# Patient Record
Sex: Female | Born: 1960 | Race: White | Hispanic: No | Marital: Married | Smoking: Never smoker
Health system: Southern US, Community
[De-identification: ages and names within clinical notes are randomized; demographics above are authoritative.]

## PROBLEM LIST (undated history)

## (undated) DIAGNOSIS — T7840XA Allergy, unspecified, initial encounter: Secondary | ICD-10-CM

## (undated) DIAGNOSIS — G2581 Restless legs syndrome: Secondary | ICD-10-CM

## (undated) HISTORY — DX: Allergy, unspecified, initial encounter: T78.40XA

## (undated) HISTORY — PX: TUBAL LIGATION: SHX77

## (undated) HISTORY — PX: EYE SURGERY: SHX253

## (undated) HISTORY — PX: NASAL SEPTUM SURGERY: SHX37

## (undated) HISTORY — PX: TMJ ARTHROPLASTY: SHX1066

---

## 2003-04-18 ENCOUNTER — Encounter (INDEPENDENT_AMBULATORY_CARE_PROVIDER_SITE_OTHER): Payer: Self-pay | Admitting: *Deleted

## 2003-04-19 ENCOUNTER — Ambulatory Visit (HOSPITAL_COMMUNITY): Admission: RE | Admit: 2003-04-19 | Discharge: 2003-04-19 | Payer: Self-pay | Admitting: Obstetrics and Gynecology

## 2004-07-09 ENCOUNTER — Other Ambulatory Visit: Admission: RE | Admit: 2004-07-09 | Discharge: 2004-07-09 | Payer: Self-pay | Admitting: Family Medicine

## 2005-07-12 ENCOUNTER — Other Ambulatory Visit: Admission: RE | Admit: 2005-07-12 | Discharge: 2005-07-12 | Payer: Self-pay | Admitting: Family Medicine

## 2006-02-08 ENCOUNTER — Emergency Department (HOSPITAL_COMMUNITY): Admission: EM | Admit: 2006-02-08 | Discharge: 2006-02-08 | Payer: Self-pay | Admitting: Family Medicine

## 2006-05-14 ENCOUNTER — Emergency Department (HOSPITAL_COMMUNITY): Admission: EM | Admit: 2006-05-14 | Discharge: 2006-05-14 | Payer: Self-pay | Admitting: *Deleted

## 2006-08-01 ENCOUNTER — Other Ambulatory Visit: Admission: RE | Admit: 2006-08-01 | Discharge: 2006-08-01 | Payer: Self-pay | Admitting: Family Medicine

## 2007-08-03 ENCOUNTER — Other Ambulatory Visit: Admission: RE | Admit: 2007-08-03 | Discharge: 2007-08-03 | Payer: Self-pay | Admitting: Family Medicine

## 2008-08-02 ENCOUNTER — Other Ambulatory Visit: Admission: RE | Admit: 2008-08-02 | Discharge: 2008-08-02 | Payer: Self-pay | Admitting: Family Medicine

## 2008-11-13 ENCOUNTER — Encounter: Admission: RE | Admit: 2008-11-13 | Discharge: 2008-11-13 | Payer: Self-pay | Admitting: Family Medicine

## 2009-08-04 ENCOUNTER — Other Ambulatory Visit: Admission: RE | Admit: 2009-08-04 | Discharge: 2009-08-04 | Payer: Self-pay | Admitting: Family Medicine

## 2010-08-26 ENCOUNTER — Other Ambulatory Visit: Admission: RE | Admit: 2010-08-26 | Discharge: 2010-08-26 | Payer: Self-pay | Admitting: Family Medicine

## 2010-10-25 ENCOUNTER — Encounter: Payer: Self-pay | Admitting: Family Medicine

## 2010-11-30 ENCOUNTER — Other Ambulatory Visit: Payer: Self-pay | Admitting: Family Medicine

## 2010-11-30 ENCOUNTER — Other Ambulatory Visit (HOSPITAL_COMMUNITY)
Admission: RE | Admit: 2010-11-30 | Discharge: 2010-11-30 | Disposition: A | Discharge status: Home / Self Care | Payer: PRIVATE HEALTH INSURANCE | Source: Ambulatory Visit | Attending: Family Medicine | Admitting: Family Medicine

## 2010-11-30 DIAGNOSIS — Z0142 Encounter for cervical smear to confirm findings of recent normal smear following initial abnormal smear: Secondary | ICD-10-CM | POA: Insufficient documentation

## 2011-02-19 NOTE — Op Note (Signed)
NAME:  Miranda Salazar, Miranda Salazar                          ACCOUNT NO.:  [id number]   MEDICAL RECORD NO.:  [id number]                   PATIENT TYPE:  AMB   LOCATION:  SDC                                  FACILITY:  [facility]   PHYSICIAN:  Sherry A. Rosalio Macadamia, M.D.           DATE OF BIRTH:  [birth year]   DATE OF PROCEDURE:  04/19/2003  DATE OF DISCHARGE:                                 OPERATIVE REPORT   PREOPERATIVE DIAGNOSIS:  Menorrhagia.   POSTOPERATIVE DIAGNOSIS:  Menorrhagia.   PROCEDURE:  Dilation and curettage, hysteroscopy with resectoscope, and  endometrial cryoablation.   SURGEON:  Sherry A. Rosalio Macadamia, M.D.   ANESTHESIA:  MAC.   INDICATIONS FOR PROCEDURE:  This is a 50 year old G2, P1-0-1-1 woman who has  been having excessively heavy menstrual periods both in latent and amount.  The patient had ultrasound in the office which revealed small fibroids with  endometrial cavity apparently normal.  Because of this, the patient is  brought to the operating room for [facility], hysteroscopy with the resectoscope,  and endometrial ablation to try to control her bleeding.   FINDINGS:  9 weeks size retroverted uterus, no adnexal mass, no significant  endometrial pathology, enlarged endometrial cavity.   SPECIMENS:  Endometrial resection.   PROCEDURE:  The patient was brought to the operating room, given adequate IV  sedation.  She was placed in the dorsal lithotomy position.  Her perineum  and vagina were washed with Hibiclens.  Pelvic examination was performed.  The patient was draped in a sterile fashion.  A speculum was placed in the  vagina.  A paracervical block was administered with 1% Nesacaine.  The  anterior lip of the cervix was grasped with a single tooth tenaculum.  The  cervix was founded, the cervix was dilated with Shawnie Pons dilators initially to  a #25.  A small hysteroscope was introduced and it was felt that there may  be some endometrial pathology.  The hysteroscope was removed.   Dilatation  was continued to a #31.  The resectoscope was placed.  The resectoscope  revealed no significant pathology but endometrial resections were taken for  sampling and to decrease the endometrial thickness to make the cryoablation  more effective.  The scope was removed and the cryoablation probe was  placed, first in the right cornua, it went through a 6 minute freeze at  approximately a -89 to -91 degrees freeze.  The probe was heated and then  replaced in the left cornua and the same procedure was performed.  After  reheating, the probe was removed.  The tenaculum was removed.  There was no  significant bleeding.  The speculum was removed.  The patient was taken out  of the dorsal lithotomy position.  She was awakened, she was removed from  the operating table to the stretcher.  Complications were none.  Estimated  blood loss less than 10 mL.  Sorbitol differential -30  mL.                                               Sherry A. Rosalio Macadamia, M.D.   SAD/MEDQ  D:  04/19/2003  T:  04/19/2003  Job:  [id number]

## 2011-06-03 ENCOUNTER — Emergency Department (HOSPITAL_COMMUNITY)
Admission: EM | Admit: 2011-06-03 | Discharge: 2011-06-03 | Disposition: A | Discharge status: Home / Self Care | Payer: PRIVATE HEALTH INSURANCE | Attending: Emergency Medicine | Admitting: Emergency Medicine

## 2011-06-03 ENCOUNTER — Emergency Department (HOSPITAL_COMMUNITY): Payer: PRIVATE HEALTH INSURANCE

## 2011-06-03 DIAGNOSIS — R51 Headache: Secondary | ICD-10-CM | POA: Insufficient documentation

## 2011-06-03 DIAGNOSIS — R1110 Vomiting, unspecified: Secondary | ICD-10-CM | POA: Insufficient documentation

## 2011-06-03 DIAGNOSIS — R42 Dizziness and giddiness: Secondary | ICD-10-CM | POA: Insufficient documentation

## 2011-06-03 DIAGNOSIS — J329 Chronic sinusitis, unspecified: Secondary | ICD-10-CM | POA: Insufficient documentation

## 2011-11-15 ENCOUNTER — Other Ambulatory Visit (HOSPITAL_COMMUNITY): Payer: Self-pay | Admitting: Gastroenterology

## 2011-11-15 DIAGNOSIS — Q438 Other specified congenital malformations of intestine: Secondary | ICD-10-CM

## 2011-11-16 ENCOUNTER — Ambulatory Visit (HOSPITAL_COMMUNITY)
Admission: RE | Admit: 2011-11-16 | Discharge: 2011-11-16 | Disposition: A | Discharge status: Home / Self Care | Payer: PRIVATE HEALTH INSURANCE | Source: Ambulatory Visit | Attending: Gastroenterology | Admitting: Gastroenterology

## 2011-11-16 DIAGNOSIS — K5730 Diverticulosis of large intestine without perforation or abscess without bleeding: Secondary | ICD-10-CM | POA: Insufficient documentation

## 2011-11-16 DIAGNOSIS — Q438 Other specified congenital malformations of intestine: Secondary | ICD-10-CM

## 2012-10-06 ENCOUNTER — Ambulatory Visit
Admission: RE | Admit: 2012-10-06 | Discharge: 2012-10-06 | Disposition: A | Discharge status: Home / Self Care | Payer: PRIVATE HEALTH INSURANCE | Source: Ambulatory Visit | Attending: Otolaryngology | Admitting: Otolaryngology

## 2012-10-06 ENCOUNTER — Other Ambulatory Visit: Payer: Self-pay | Admitting: Otolaryngology

## 2012-10-06 DIAGNOSIS — R42 Dizziness and giddiness: Secondary | ICD-10-CM

## 2012-10-17 ENCOUNTER — Other Ambulatory Visit: Payer: Self-pay | Admitting: Otolaryngology

## 2012-10-17 DIAGNOSIS — J320 Chronic maxillary sinusitis: Secondary | ICD-10-CM

## 2012-10-23 ENCOUNTER — Ambulatory Visit
Admission: RE | Admit: 2012-10-23 | Discharge: 2012-10-23 | Disposition: A | Discharge status: Home / Self Care | Payer: PRIVATE HEALTH INSURANCE | Source: Ambulatory Visit | Attending: Otolaryngology | Admitting: Otolaryngology

## 2012-10-23 DIAGNOSIS — J320 Chronic maxillary sinusitis: Secondary | ICD-10-CM

## 2012-10-25 ENCOUNTER — Other Ambulatory Visit: Payer: Self-pay | Admitting: Otolaryngology

## 2012-12-22 ENCOUNTER — Other Ambulatory Visit: Payer: Self-pay | Admitting: Otolaryngology

## 2013-12-05 ENCOUNTER — Emergency Department (HOSPITAL_COMMUNITY): Payer: PRIVATE HEALTH INSURANCE

## 2013-12-05 ENCOUNTER — Emergency Department (HOSPITAL_COMMUNITY)
Admission: EM | Admit: 2013-12-05 | Discharge: 2013-12-05 | Disposition: A | Discharge status: Home / Self Care | Payer: PRIVATE HEALTH INSURANCE | Attending: Emergency Medicine | Admitting: Emergency Medicine

## 2013-12-05 ENCOUNTER — Encounter (HOSPITAL_COMMUNITY): Payer: Self-pay | Admitting: Emergency Medicine

## 2013-12-05 DIAGNOSIS — J189 Pneumonia, unspecified organism: Secondary | ICD-10-CM

## 2013-12-05 DIAGNOSIS — Z79899 Other long term (current) drug therapy: Secondary | ICD-10-CM | POA: Insufficient documentation

## 2013-12-05 DIAGNOSIS — R000 Tachycardia, unspecified: Secondary | ICD-10-CM | POA: Insufficient documentation

## 2013-12-05 DIAGNOSIS — G2581 Restless legs syndrome: Secondary | ICD-10-CM | POA: Insufficient documentation

## 2013-12-05 DIAGNOSIS — J159 Unspecified bacterial pneumonia: Secondary | ICD-10-CM | POA: Insufficient documentation

## 2013-12-05 DIAGNOSIS — Z880 Allergy status to penicillin: Secondary | ICD-10-CM | POA: Insufficient documentation

## 2013-12-05 HISTORY — DX: Restless legs syndrome: G25.81

## 2013-12-05 LAB — CBC WITH DIFFERENTIAL/PLATELET
BASOS ABS: 0 10*3/uL (ref 0.0–0.1)
Basophils Relative: 0 % (ref 0–1)
EOS ABS: 0 10*3/uL (ref 0.0–0.7)
EOS PCT: 0 % (ref 0–5)
HEMATOCRIT: 36.6 % (ref 36.0–46.0)
Hemoglobin: 13.1 g/dL (ref 12.0–15.0)
LYMPHS PCT: 17 % (ref 12–46)
Lymphs Abs: 0.8 10*3/uL (ref 0.7–4.0)
MCH: 31.7 pg (ref 26.0–34.0)
MCHC: 35.8 g/dL (ref 30.0–36.0)
MCV: 88.6 fL (ref 78.0–100.0)
MONO ABS: 0.7 10*3/uL (ref 0.1–1.0)
Monocytes Relative: 15 % — ABNORMAL HIGH (ref 3–12)
Neutro Abs: 3.1 10*3/uL (ref 1.7–7.7)
Neutrophils Relative %: 68 % (ref 43–77)
PLATELETS: 176 10*3/uL (ref 150–400)
RBC: 4.13 MIL/uL (ref 3.87–5.11)
RDW: 12.9 % (ref 11.5–15.5)
WBC: 4.6 10*3/uL (ref 4.0–10.5)

## 2013-12-05 LAB — BASIC METABOLIC PANEL
BUN: 10 mg/dL (ref 6–23)
CALCIUM: 8.8 mg/dL (ref 8.4–10.5)
CO2: 19 meq/L (ref 19–32)
CREATININE: 0.58 mg/dL (ref 0.50–1.10)
Chloride: 100 mEq/L (ref 96–112)
GFR calc Af Amer: >90 mL/min (ref >90)
Glucose, Bld: 124 mg/dL — ABNORMAL HIGH (ref 70–99)
Potassium: 3.3 mEq/L — ABNORMAL LOW (ref 3.7–5.3)
SODIUM: 136 meq/L — AB (ref 137–147)

## 2013-12-05 LAB — I-STAT TROPONIN, ED: TROPONIN I, POC: 0 ng/mL (ref 0.00–0.08)

## 2013-12-05 MED ORDER — ONDANSETRON HCL 4 MG/2ML IJ SOLN
4.0000 mg | Freq: Once | INTRAMUSCULAR | Status: AC
  Administered 2013-12-05: 4 mg via INTRAVENOUS
  Filled 2013-12-05: qty 2

## 2013-12-05 MED ORDER — DEXTROSE 5 % IV SOLN
500.0000 mg | Freq: Once | INTRAVENOUS | Status: AC
  Administered 2013-12-05: 500 mg via INTRAVENOUS

## 2013-12-05 MED ORDER — HYDROCODONE-HOMATROPINE 5-1.5 MG/5ML PO SYRP
5.0000 mL | ORAL_SOLUTION | Freq: Once | ORAL | Status: AC
  Administered 2013-12-05: 5 mL via ORAL
  Filled 2013-12-05: qty 5

## 2013-12-05 MED ORDER — KETOROLAC TROMETHAMINE 30 MG/ML IJ SOLN
30.0000 mg | Freq: Once | INTRAMUSCULAR | Status: AC
  Administered 2013-12-05: 30 mg via INTRAVENOUS
  Filled 2013-12-05: qty 1

## 2013-12-05 MED ORDER — LORAZEPAM 2 MG/ML IJ SOLN
0.5000 mg | Freq: Once | INTRAMUSCULAR | Status: AC
  Administered 2013-12-05: 0.5 mg via INTRAVENOUS
  Filled 2013-12-05: qty 1

## 2013-12-05 MED ORDER — SODIUM CHLORIDE 0.9 % IV BOLUS (SEPSIS)
1000.0000 mL | Freq: Once | INTRAVENOUS | Status: AC
  Administered 2013-12-05: 1000 mL via INTRAVENOUS

## 2013-12-05 MED ORDER — HYDROCODONE-HOMATROPINE 5-1.5 MG/5ML PO SYRP: 5.0000 mL | ORAL_SOLUTION | Freq: Four times a day (QID) | ORAL | Status: DC | PRN

## 2013-12-05 MED ORDER — LEVOFLOXACIN 500 MG PO TABS: 500.0000 mg | ORAL_TABLET | Freq: Every day | ORAL | Status: DC

## 2013-12-05 MED ORDER — HYDROMORPHONE HCL PF 1 MG/ML IJ SOLN
1.0000 mg | Freq: Once | INTRAMUSCULAR | Status: AC
  Administered 2013-12-05: 1 mg via INTRAVENOUS
  Filled 2013-12-05: qty 1

## 2013-12-05 NOTE — ED Provider Notes (Signed)
CSN: [id number]     Arrival date & time 12/05/13  1558 History   First MD Initiated Contact with Patient 12/05/13 1600     Chief Complaint  Patient presents with  . Chest Pain  . Shortness of Breath     (Consider location/radiation/quality/duration/timing/severity/associated sxs/prior Treatment) HPI Comments: Patient is a 53 year old female with a past medical history of restless leg syndrome who presents via EMS with chest pain that started yesterday. The pain started gradually and remained constant since the onset. The pain is located in her central chest and does not radiate. The pain is aching and severe. Patient reports associated SOB, cough, chills, and subjective fever. Patient did not try anything for symptoms. No other associated symptoms.    Past Medical History  Diagnosis Date  . Restless leg syndrome    Past Surgical History  Procedure Laterality Date  . Tmj arthroplasty    . Tubal ligation     History reviewed. No pertinent family history. History  Substance Use Topics  . Smoking status: Never Smoker   . Smokeless tobacco: Not on file  . Alcohol Use: Yes   OB History   Grav Para Term Preterm Abortions TAB SAB Ect Mult Living                 Review of Systems  Constitutional: Positive for fever and chills. Negative for fatigue.  HENT: Negative for trouble swallowing.   Eyes: Negative for visual disturbance.  Respiratory: Positive for cough and shortness of breath.   Cardiovascular: Positive for chest pain. Negative for palpitations.  Gastrointestinal: Negative for nausea, vomiting, abdominal pain and diarrhea.  Genitourinary: Negative for dysuria and difficulty urinating.  Musculoskeletal: Negative for arthralgias and neck pain.  Skin: Negative for color change.  Neurological: Negative for dizziness and weakness.  Psychiatric/Behavioral: Negative for dysphoric mood.      Allergies  Penicillins  Home Medications   Current Outpatient Rx  Name  Route   Sig  Dispense  Refill  . FIBER PO   Oral   Take 2 tablets by mouth daily as needed (for regularity).         Marland Kitchen ketotifen (ZADITOR) 0.025 % ophthalmic solution   Both Eyes   Place 1 drop into both eyes daily.         Bertram Gala Glycol-Propyl Glycol (SYSTANE) 0.4-0.3 % SOLN   Ophthalmic   Apply 2 drops to eye daily.         . pramipexole (MIRAPEX) 0.25 MG tablet   Oral   Take 0.25 mg by mouth 2 (two) times daily as needed (for restless legs).          . traZODone (DESYREL) 50 MG tablet   Oral   Take 50 mg by mouth at bedtime as needed for sleep.          BP 139/76  Pulse 106  Temp(Src) 100.8 F (38.2 C) (Oral)  Resp 18  SpO2 95%  LMP 10/16/2012 Physical Exam  Nursing note and vitals reviewed. Constitutional: She is oriented to person, place, and time. She appears well-developed and well-nourished. No distress.  HENT:  Head: Normocephalic and atraumatic.  Eyes: Conjunctivae and EOM are normal. Pupils are equal, round, and reactive to light.  Neck: Normal range of motion. Neck supple.  Cardiovascular: Regular rhythm.  Exam reveals no gallop and no friction rub.   No murmur heard. tachycardic  Pulmonary/Chest: Effort normal. She has no wheezes. She has no rales. She exhibits no  tenderness.  Rhonchi noted in right lower lung base. Patient is tachypneic.   Abdominal: Soft. She exhibits no distension. There is no tenderness. There is no rebound and no guarding.  Musculoskeletal: Normal range of motion.  Neurological: She is alert and oriented to person, place, and time. Coordination normal.  Speech is goal-oriented. Moves limbs without ataxia.   Skin: Skin is warm and dry.  Psychiatric: She has a normal mood and affect. Her behavior is normal.    ED Course  Procedures (including critical care time) Labs Review Labs Reviewed  CBC WITH DIFFERENTIAL - Abnormal; Notable for the following:    Monocytes Relative 15 (*)    All other components within normal limits   BASIC METABOLIC PANEL - Abnormal; Notable for the following:    Sodium 136 (*)    Potassium 3.3 (*)    Glucose, Bld 124 (*)    All other components within normal limits  I-STAT TROPOININ, ED   Imaging Review Dg Chest 2 View  12/05/2013   CLINICAL DATA:  Chest pain  EXAM: CHEST  2 VIEW  COMPARISON:  None.  FINDINGS: There is right lower lobe airspace disease. There is no other focal parenchymal opacity. There is no pleural effusion or pneumothorax. The heart and mediastinal contours are unremarkable.  The osseous structures are unremarkable.  IMPRESSION: Hazy right lower lobe airspace disease concerning for pneumonia.   Electronically Signed   By: Elige KoHetal  Patel   On: 12/05/2013 17:21     EKG Interpretation None      MDM   Final diagnoses:  CAP (community acquired pneumonia)    6:39 PM Patient's chest xray shows pneumonia. Labs unremarkable for acute changes. Patient is tachycardic and febrile at 100.8. Patient received toradol and zofran for symptoms. Patient given fluids. Patient's EKG and troponin unremarkable. Patient is tachypneic.   9:24 PM Patient is feeling better after IV azithromycin, pain medication and ativan. Patient will be discharged with Levaquin and hycodan. Patient advised to return with worsening or concerning symptoms. No further evaluation needed at this time.   Emilia BeckKaitlyn Kalisha Keadle, PA-C 12/05/13 2125

## 2013-12-05 NOTE — Discharge Instructions (Signed)
Take Levaquin as directed until gone. Take hycodan as needed for cough. Refer to attached documents for more information. Return to the ED with worsening or concerning symptoms.  °

## 2013-12-05 NOTE — ED Notes (Signed)
Pt took home dose of restless leg syndrome medication- PA ok with this.

## 2013-12-05 NOTE — ED Notes (Signed)
Pt starting having cough and feeling SOB yesterday.  Coughing up green sputum. Today symptoms worsened and developed chest pain in center of chest 4/10. Sternum tender to palpation. Pt having chills/night sweats. 1000mg  PO tylenol, 700mL NS, EKG showed ST. 150/87, HR 112, RA 98%. Lungs clears

## 2013-12-06 NOTE — ED Provider Notes (Signed)
Medical screening examination/treatment/procedure(s) were performed by non-physician practitioner and as supervising physician I was immediately available for consultation/collaboration.   EKG Interpretation None        Darcell Sabino M Jacai Kipp, MD 12/06/13 1755 

## 2014-09-24 ENCOUNTER — Other Ambulatory Visit (HOSPITAL_COMMUNITY)
Admission: RE | Admit: 2014-09-24 | Discharge: 2014-09-24 | Disposition: A | Discharge status: Home / Self Care | Payer: PRIVATE HEALTH INSURANCE | Source: Ambulatory Visit | Attending: Family Medicine | Admitting: Family Medicine

## 2014-09-24 ENCOUNTER — Other Ambulatory Visit: Payer: Self-pay | Admitting: Family Medicine

## 2014-09-24 DIAGNOSIS — Z01419 Encounter for gynecological examination (general) (routine) without abnormal findings: Secondary | ICD-10-CM | POA: Diagnosis not present

## 2014-09-26 LAB — CYTOLOGY - PAP

## 2014-11-04 ENCOUNTER — Other Ambulatory Visit: Payer: Self-pay

## 2015-09-26 ENCOUNTER — Ambulatory Visit (INDEPENDENT_AMBULATORY_CARE_PROVIDER_SITE_OTHER): Payer: PRIVATE HEALTH INSURANCE | Admitting: Emergency Medicine

## 2015-09-26 VITALS — BP 122/76 | HR 86 | Temp 98.4°F | Resp 16 | Ht 64.0 in | Wt 134.6 lb

## 2015-09-26 DIAGNOSIS — J0140 Acute pansinusitis, unspecified: Secondary | ICD-10-CM

## 2015-09-26 DIAGNOSIS — J209 Acute bronchitis, unspecified: Secondary | ICD-10-CM | POA: Diagnosis not present

## 2015-09-26 DIAGNOSIS — R05 Cough: Secondary | ICD-10-CM | POA: Diagnosis not present

## 2015-09-26 MED ORDER — PSEUDOEPHEDRINE-GUAIFENESIN ER 60-600 MG PO TB12: 1.0000 | ORAL_TABLET | Freq: Two times a day (BID) | ORAL | Status: AC

## 2015-09-26 MED ORDER — HYDROCOD POLST-CPM POLST ER 10-8 MG/5ML PO SUER: 5.0000 mL | Freq: Two times a day (BID) | ORAL | Status: DC

## 2015-09-26 MED ORDER — LEVOFLOXACIN 500 MG PO TABS: 500.0000 mg | ORAL_TABLET | Freq: Every day | ORAL | Status: AC

## 2015-09-26 NOTE — Progress Notes (Signed)
Subjective:  Patient ID: Miranda Salazar, female    DOB: 02-19-1961  Age: 54 y.o. MRN: [id number]  CC: sinus pressure; Sore Throat; congestion; and Cough   HPI Miranda Salazar presents   Patient has a week lng histry of nasal congestion postnsal drainage and nasal discharge is purulent. She has no fever or chills. She has  Sore throat. She has no pain in her ears. She has a cough productive of mucopurulent sputum. She has no wheezing or shortness of breath. She's had no improvement with over-the-counter medication. She has no nausea vomiting or stool change. No rash.  History Miranda Salazar has a past medical history of Restless leg syndrome and Allergy.   She has past surgical history that includes TMJ Arthroplasty; Tubal ligation; and Eye surgery.   Her  family history includes Hyperlipidemia in her mother.  She   reports that she has never smoked. She does not have any smokeless tobacco history on file. She reports that she drinks alcohol. She reports that she does not use illicit drugs.  Outpatient Prescriptions Prior to Visit  Medication Sig Dispense Refill  . pramipexole (MIRAPEX) 0.25 MG tablet Take 0.25 mg by mouth 2 (two) times daily as needed (for restless legs).     . FIBER PO Take 2 tablets by mouth daily as needed (for regularity). Reported on 09/26/2015    . HYDROcodone-homatropine (HYCODAN) 5-1.5 MG/5ML syrup Take 5 mLs by mouth every 6 (six) hours as needed. (Patient not taking: Reported on 09/26/2015) 120 mL 0  . ketotifen (ZADITOR) 0.025 % ophthalmic solution Place 1 drop into both eyes daily. Reported on 09/26/2015    . levofloxacin (LEVAQUIN) 500 MG tablet Take 1 tablet (500 mg total) by mouth daily. (Patient not taking: Reported on 09/26/2015) 7 tablet 0  . Polyethyl Glycol-Propyl Glycol (SYSTANE) 0.4-0.3 % SOLN Apply 2 drops to eye daily. Reported on 09/26/2015    . traZODone (DESYREL) 50 MG tablet Take 50 mg by mouth at bedtime as needed for sleep. Reported on 09/26/2015      No facility-administered medications prior to visit.    Social History   Social History  . Marital Status: Married    Spouse Name: N/A  . Number of Children: N/A  . Years of Education: N/A   Social History Main Topics  . Smoking status: Never Smoker   . Smokeless tobacco: None  . Alcohol Use: Yes  . Drug Use: No  . Sexual Activity: Not Asked   Other Topics Concern  . None   Social History Narrative     Review of Systems  Constitutional: Negative for fever, chills and appetite change.  HENT: Positive for congestion, postnasal drip, rhinorrhea, sinus pressure and sore throat. Negative for ear pain.   Eyes: Negative for pain and redness.  Respiratory: Positive for cough and chest tightness. Negative for shortness of breath and wheezing.   Cardiovascular: Negative for leg swelling.  Gastrointestinal: Negative for nausea, vomiting, abdominal pain, diarrhea, constipation and blood in stool.  Endocrine: Negative for polyuria.  Genitourinary: Negative for dysuria, urgency, frequency and flank pain.  Musculoskeletal: Negative for gait problem.  Skin: Negative for rash.  Neurological: Negative for weakness and headaches.  Psychiatric/Behavioral: Negative for confusion and decreased concentration. The patient is not nervous/anxious.     Objective:  BP 122/76 mmHg  Pulse 86  Temp(Src) 98.4 F (36.9 C) (Oral)  Resp 16  Ht  (1.626 m)  Wt 134 lb 9.6 oz (61.054 kg)  BMI 23.09  kg/m2  SpO2 98%  LMP 10/16/2012  Physical Exam    Assessment & Plan:   Miranda Salazar was seen today for sinus pressure, sore throat, congestion and cough.  Diagnoses and all orders for this visit:  Acute bronchitis, unspecified organism  Acute pansinusitis, recurrence not specified  Other orders -     pseudoephedrine-guaifenesin (MUCINEX D) 60-600 MG 12 hr tablet; Take 1 tablet by mouth every 12 (twelve) hours. -     chlorpheniramine-HYDROcodone (TUSSIONEX PENNKINETIC ER) 10-8 MG/5ML SUER;  Take 5 mLs by mouth 2 (two) times daily. -     levofloxacin (LEVAQUIN) 500 MG tablet; Take 1 tablet (500 mg total) by mouth daily.  I am having Miranda Salazar start on pseudoephedrine-guaifenesin, chlorpheniramine-HYDROcodone, and levofloxacin. I am also having her maintain her traZODone, FIBER PO, Polyethyl Glycol-Propyl Glycol, ketotifen, pramipexole, levofloxacin, and HYDROcodone-homatropine.  Meds ordered this encounter  Medications  . pseudoephedrine-guaifenesin (MUCINEX D) 60-600 MG 12 hr tablet    Sig: Take 1 tablet by mouth every 12 (twelve) hours.    Dispense:  18 tablet    Refill:  0  . chlorpheniramine-HYDROcodone (TUSSIONEX PENNKINETIC ER) 10-8 MG/5ML SUER    Sig: Take 5 mLs by mouth 2 (two) times daily.    Dispense:  60 mL    Refill:  0  . levofloxacin (LEVAQUIN) 500 MG tablet    Sig: Take 1 tablet (500 mg total) by mouth daily.    Dispense:  10 tablet    Refill:  0    Appropriate red flag conditions were discussed with the patient as well as actions that should be taken.  Patient expressed his understanding.  Follow-up: Return if symptoms worsen or fail to improve.  Carmelina DaneAnderson, Jeffery S, MD

## 2015-09-26 NOTE — Patient Instructions (Signed)

## 2016-06-08 ENCOUNTER — Other Ambulatory Visit: Payer: Self-pay | Admitting: Family Medicine

## 2016-06-08 ENCOUNTER — Ambulatory Visit
Admission: RE | Admit: 2016-06-08 | Discharge: 2016-06-08 | Disposition: A | Discharge status: Home / Self Care | Payer: Managed Care, Other (non HMO) | Source: Ambulatory Visit | Attending: Family Medicine | Admitting: Family Medicine

## 2016-06-08 DIAGNOSIS — M79644 Pain in right finger(s): Secondary | ICD-10-CM

## 2017-08-22 ENCOUNTER — Other Ambulatory Visit: Payer: Self-pay | Admitting: Ophthalmology

## 2017-12-02 ENCOUNTER — Other Ambulatory Visit (HOSPITAL_COMMUNITY)
Admission: RE | Admit: 2017-12-02 | Discharge: 2017-12-02 | Disposition: A | Discharge status: Home / Self Care | Payer: Managed Care, Other (non HMO) | Source: Ambulatory Visit | Attending: Family Medicine | Admitting: Family Medicine

## 2017-12-02 ENCOUNTER — Other Ambulatory Visit: Payer: Self-pay | Admitting: Family Medicine

## 2017-12-02 DIAGNOSIS — Z124 Encounter for screening for malignant neoplasm of cervix: Secondary | ICD-10-CM | POA: Insufficient documentation

## 2017-12-06 LAB — CYTOLOGY - PAP: Diagnosis: NEGATIVE

## 2018-01-27 ENCOUNTER — Encounter: Payer: Self-pay | Admitting: Neurology

## 2018-06-11 NOTE — Progress Notes (Signed)
[facility] Clinic Note - Initial Visit   Date: 06/12/18  EARNEST Salazar MRN: [id number] DOB: 1961-06-21   Dear Dr. Manus Gunning:  Thank you for your kind referral of Paticia Moster Salazar for consultation of restless leg syndrome. Although his history is well known to you, please allow Miranda Salazar to reiterate it for the purpose of our medical record. The patient was accompanied to the clinic by self.    History of Present Illness: Miranda Salazar is a 57 y.o. right-handed Caucasian female with allergies and insomnia referred for evaluation of restless leg syndrome.  She also complains of low back pain and speech changes.     Symptoms started around the age of 57 when she finished with the [employer].  She has achy, restless sensation of the legs which is worse in the car, plane, and night.  Symptoms gradually progressed where she sought medical advice for symptoms and was started on on mirapex in September 2013 which was control symptoms.    In March 2019, she began needing higher dose of mirapex to 0.75mg /d raising concern of augmentation.  Therefore, she was offered gabapentin 100mg  BID and stopped this due to sedation and ineffectiveness.  Her current dose of mirapex controls symptoms very well.  She self adjusts the dose of mirapex based on the level of activity in her day. She typically takes mirapex 0.25mg  (2) tablets at bedtime which controls symptoms.  On days when she is travelling, she takes mirapex 0.25mg  (1) tablet 30-min prior to travel.   She also complains of speech changes, described as word-finding difficulty in conversation or remembering names.  This occurs sporadically throughout the day.  She does not have slurred speech.  Sometimes, pronunciation of works is difficult. 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  She gives a lot of presentation and speaks pubically  regularly for work.  No problems with comprehension or reading. She denies misplacing items and maintains all IADLs.   Sometimes, she repeats question and feels short-term memory is not as strong as before.  Mood is good. Sleep is fair.   For the past two months, she has been experiencing achy low back pain, as if she was hit in the back. She does not recall preceding injury or trauma.  There is no associated radicular pain or paresthesias of the legs. NSAIDs provides relief. She does a significant amount of travel for work and feels this may be contributing to it.   Out-side paper records, electronic medical record, and images have been reviewed where available and summarized as:  Labs 12/02/2017:  Chol 194  TG 64  HDL 65  LDL 116   MRI brain 11/13/2008:  No cause of the patient's described symptoms are identified.  Normal appearance of the brain.  Right maxillary sinusitis.  Past Medical History:  Diagnosis Date  . Allergy   . Restless leg syndrome     Past Surgical History:  Procedure Laterality Date  . EYE SURGERY    . TMJ ARTHROPLASTY    . TUBAL LIGATION       Medications:  Outpatient Encounter Medications as of 06/12/2018  Medication Sig Note  . loratadine (CLARITIN) 10 MG tablet Take 10 mg by mouth daily as needed for allergies.   Bertram Gala Glycol-Propyl Glycol (SYSTANE) 0.4-0.3 % SOLN Apply 2 drops to eye daily. Reported on 09/26/2015   . pramipexole (MIRAPEX)  0.25 MG tablet Take (2) tablets 2 hours before bedtime.  Ok to take an extra 0.5 - 1 tablet as needed for RLS symptoms.   . traZODone (DESYREL) 50 MG tablet Take 50 mg by mouth at bedtime as needed for sleep. Reported on 09/26/2015   . [DISCONTINUED] pramipexole (MIRAPEX) 0.25 MG tablet Take 0.25 mg by mouth 2 (two) times daily as needed (for restless legs).  12/05/2013: Usually takes one daily, but can take addt'l tab as needed for leg pain  . [DISCONTINUED] chlorpheniramine-HYDROcodone (TUSSIONEX PENNKINETIC ER) 10-8 MG/5ML  SUER Take 5 mLs by mouth 2 (two) times daily.   . [DISCONTINUED] FIBER PO Take 2 tablets by mouth daily as needed (for regularity). Reported on 09/26/2015   . [DISCONTINUED] HYDROcodone-homatropine (HYCODAN) 5-1.5 MG/5ML syrup Take 5 mLs by mouth every 6 (six) hours as needed. (Patient not taking: Reported on 09/26/2015)   . [DISCONTINUED] ketotifen (ZADITOR) 0.025 % ophthalmic solution Place 1 drop into both eyes daily. Reported on 09/26/2015   . [DISCONTINUED] levofloxacin (LEVAQUIN) 500 MG tablet Take 1 tablet (500 mg total) by mouth daily. (Patient not taking: Reported on 09/26/2015)    No facility-administered encounter medications on file as of 06/12/2018.      Allergies:  Allergies  Allergen Reactions  . Penicillins Rash    Family History: Family History  Problem Relation Age of Onset  . Hyperlipidemia Mother   . Hypertension Mother   . Hypertension Father   . Healthy Brother     Social History: Social History   Tobacco Use  . Smoking status: Never Smoker  . Smokeless tobacco: Never Used  Substance Use Topics  . Alcohol use: Yes    Comment: 3-4 times week, socially  . Drug use: No   Social History   Social History Narrative   Lives with husband in a 2 story home.  Has one child.     Works as a Production designer, theatre/television/film for [employer].     Education: some college.     Review of Systems:  CONSTITUTIONAL: No fevers, chills, night sweats, or weight loss.   EYES: No visual changes or eye pain ENT: No hearing changes.  No history of nose bleeds.   RESPIRATORY: No cough, wheezing and shortness of breath.   CARDIOVASCULAR: Negative for chest pain, and palpitations.   GI: Negative for abdominal discomfort, blood in stools or black stools.  No recent change in bowel habits.   GU:  No history of incontinence.   MUSCLOSKELETAL: No history of joint pain or swelling.  No myalgias.   SKIN: Negative for lesions, rash, and itching.   HEMATOLOGY/ONCOLOGY: Negative for prolonged  bleeding, bruising easily, and swollen nodes.  No history of cancer.   ENDOCRINE: Negative for cold or heat intolerance, polydipsia or goiter.   PSYCH:  No depression or anxiety symptoms.   NEURO: As Above.   Vital Signs:  BP 100/70   Pulse 60   Ht 5\' 4"  (1.626 m)   Wt 142 lb 8 oz (64.6 kg)   LMP 10/16/2012   SpO2 97%   BMI 24.46 kg/m    General Medical Exam:   General:  Well appearing, comfortable.   Eyes/ENT: see cranial nerve examination.   Neck: No masses appreciated.  Full range of motion without tenderness.  No carotid bruits. Respiratory:  Clear to auscultation, good air entry bilaterally.   Cardiac:  Regular rate and rhythm, no murmur.   Extremities:  No deformities, edema, or skin discoloration.  Mild tenderness over  the lower lumbar region at the midline.  Skin:  No rashes or lesions.  Neurological Exam: MENTAL STATUS including orientation to time, place, person, recent and remote memory, attention span and concentration, language, and fund of knowledge is normal.  Speech is not dysarthric. Montreal Cognitive Assessment  06/12/2018  Visuospatial/ Executive (0/5) 4  Naming (0/3) 3  Attention: Read list of digits (0/2) 2  Attention: Read list of letters (0/1) 1  Attention: Serial 7 subtraction starting at 100 (0/3) 1  Language: Repeat phrase (0/2) 2  Language : Fluency (0/1) 1  Abstraction (0/2) 2  Delayed Recall (0/5) 3  Orientation (0/6) 6  Total 25  Adjusted Score (based on education) 25    CRANIAL NERVES: II:  No visual field defects.  Unremarkable fundi.   III-IV-VI: Pupils equal round and reactive to light.  Normal conjugate, extra-ocular eye movements in all directions of gaze.  No nystagmus.  No ptosis.   V:  Normal facial sensation.     VII:  Normal facial symmetry and movements.  No pathologic facial reflexes.  VIII:  Normal hearing and vestibular function.   IX-X:  Normal palatal movement.   XI:  Normal shoulder shrug and head rotation.   XII:   Normal tongue strength and range of motion, no deviation or fasciculation.  MOTOR: Motor strength is 5/5 throughout. No atrophy, fasciculations or abnormal movements.  No pronator drift.  Tone is normal.    MSRs:    Right                                                                 Left brachioradialis 3+  brachioradialis 3+  biceps 3+  biceps 3+  triceps 3+  triceps 3+  patellar 3+  patellar 3+  ankle jerk 2+  ankle jerk 2+  plantar response down  plantar response down   SENSORY:  Normal and symmetric perception of light touch, pinprick, vibration, and proprioception.  Romberg's sign absent.   COORDINATION/GAIT: Normal finger-to- nose-finger and heel-to-shin.  Intact rapid alternating movements bilaterally.  Able to rise from a chair without using arms.  Gait narrow based and stable. Tandem and stressed gait intact.    IMPRESSION: 1.  Restless leg syndrome.  Well-controlled on current dose of Mirapex which she takes 0.5mg  at 8:30pm and extra dose as needed when travelling.  I have asked her to try taking 0.125mg  (half tablet) for shorter travel to keep her on the lowest dose of medication needed.  For longer airline travels, ok to take 0.25mg  30-min prior to boarding.  No sign of augmentation at this time. Refills provided.  2.  Mild cognitive impairment, subjectively affecting word-finding and pronunciation of words.   This is not a fixed deficits, making stroke very unlikely.  Her cognitive testing shows mild changes affecting visuospatial skills, attention, and delayed recall.  Given the multi domain involvement, it is possible cognitive changes are due to poor sleep habits or more systemic etiology, so will check TSH and vitamin B12.  Reassured patient that I do not see any sign of early dementia or stroke.  She does not have any vascular risk factors.  Recommend healthy sleep habits and starting daily exercise program.  Because she speaks publicly, I offered speech therapy, if symptoms  cause social embarrassment, which she will consider if symptoms get worse.   3.  Lumbar strain.  Start low back stretching/yoga exercises. Conservative therapies with NSAIDs/tylenol and heat or ice application discussed.  Muscle relaxants were declined.  4.  Generalized hyperreflexia may indicate cervical spondylosis with canal stenosis, or may be normal for patient.  No other UMN such as increased tone, weakness, or sensory changes are appreciated.  If she develops new neck pain or any of the previously mentioned symptoms, proceed with imaging the cervical spine.    Thank you for allowing me to participate in patient's care.  If I can answer any additional questions, I would be pleased to do so.    Sincerely,    Donika K. Allena Katz, DO

## 2018-06-12 ENCOUNTER — Telehealth: Payer: Self-pay | Admitting: *Deleted

## 2018-06-12 ENCOUNTER — Other Ambulatory Visit (INDEPENDENT_AMBULATORY_CARE_PROVIDER_SITE_OTHER): Payer: Managed Care, Other (non HMO)

## 2018-06-12 ENCOUNTER — Ambulatory Visit: Payer: Managed Care, Other (non HMO) | Admitting: Neurology

## 2018-06-12 ENCOUNTER — Encounter: Payer: Self-pay | Admitting: Neurology

## 2018-06-12 ENCOUNTER — Encounter

## 2018-06-12 VITALS — BP 100/70 | HR 60 | Ht 64.0 in | Wt 142.5 lb

## 2018-06-12 DIAGNOSIS — G3184 Mild cognitive impairment, so stated: Secondary | ICD-10-CM | POA: Diagnosis not present

## 2018-06-12 DIAGNOSIS — R292 Abnormal reflex: Secondary | ICD-10-CM | POA: Diagnosis not present

## 2018-06-12 DIAGNOSIS — R4789 Other speech disturbances: Secondary | ICD-10-CM

## 2018-06-12 DIAGNOSIS — S39012A Strain of muscle, fascia and tendon of lower back, initial encounter: Secondary | ICD-10-CM

## 2018-06-12 DIAGNOSIS — G2581 Restless legs syndrome: Secondary | ICD-10-CM

## 2018-06-12 LAB — TSH: TSH: 2.69 u[IU]/mL (ref 0.35–4.50)

## 2018-06-12 LAB — VITAMIN B12: Vitamin B-12: 516 pg/mL (ref 211–911)

## 2018-06-12 MED ORDER — PRAMIPEXOLE DIHYDROCHLORIDE 0.25 MG PO TABS: ORAL_TABLET | ORAL | 3 refills | Status: DC

## 2018-06-12 NOTE — Telephone Encounter (Signed)
Left message informing patient.

## 2018-06-12 NOTE — Telephone Encounter (Signed)
-----   Message from Glendale Chard, DO sent at 06/12/2018  1:43 PM EDT ----- Please notify patient lab are within normal limits.  Thank you.

## 2018-06-12 NOTE — Patient Instructions (Addendum)
Continue Mirapex 0.5mg  - take (2) tablets at 8:30pm.  OK to take extra half - full tablet during the day as needed.  Start low back stretching exercise.  You can continue to use ibuprofen or tylenol as needed for back pain.  Apply ice and/or heat.  Please contact my office if you would like to proceed with speech therapy or formal cognitive testing.    Check labs  Return to clinic in 6 months

## 2018-11-26 ENCOUNTER — Other Ambulatory Visit: Payer: Self-pay | Admitting: Neurology

## 2018-12-12 ENCOUNTER — Other Ambulatory Visit: Payer: Self-pay | Admitting: Orthopedic Surgery

## 2018-12-12 DIAGNOSIS — M79671 Pain in right foot: Secondary | ICD-10-CM

## 2018-12-12 DIAGNOSIS — M79672 Pain in left foot: Secondary | ICD-10-CM

## 2018-12-18 ENCOUNTER — Ambulatory Visit: Payer: Managed Care, Other (non HMO) | Admitting: Neurology

## 2018-12-19 ENCOUNTER — Other Ambulatory Visit: Payer: Self-pay | Admitting: Orthopedic Surgery

## 2018-12-19 ENCOUNTER — Ambulatory Visit
Admission: RE | Admit: 2018-12-19 | Discharge: 2018-12-19 | Disposition: A | Discharge status: Home / Self Care | Payer: Managed Care, Other (non HMO) | Source: Ambulatory Visit | Attending: Orthopedic Surgery | Admitting: Orthopedic Surgery

## 2018-12-19 DIAGNOSIS — M79671 Pain in right foot: Secondary | ICD-10-CM

## 2018-12-19 DIAGNOSIS — M79672 Pain in left foot: Secondary | ICD-10-CM

## 2019-01-01 ENCOUNTER — Encounter: Payer: Self-pay | Admitting: *Deleted

## 2019-01-01 ENCOUNTER — Telehealth: Payer: Self-pay | Admitting: *Deleted

## 2019-01-01 NOTE — Telephone Encounter (Signed)
Verified medications with patient.  She will take her BP tomorrow and report to Dr. Allena Katz.

## 2019-01-02 ENCOUNTER — Encounter: Payer: Self-pay | Admitting: *Deleted

## 2019-01-02 ENCOUNTER — Other Ambulatory Visit: Payer: Self-pay

## 2019-01-02 ENCOUNTER — Encounter: Payer: Self-pay | Admitting: Neurology

## 2019-01-02 ENCOUNTER — Telehealth (INDEPENDENT_AMBULATORY_CARE_PROVIDER_SITE_OTHER): Payer: Managed Care, Other (non HMO) | Admitting: Neurology

## 2019-01-02 VITALS — BP 118/75 | Ht 64.0 in | Wt 138.0 lb

## 2019-01-02 DIAGNOSIS — G2581 Restless legs syndrome: Secondary | ICD-10-CM

## 2019-01-02 MED ORDER — PRAMIPEXOLE DIHYDROCHLORIDE 0.25 MG PO TABS: ORAL_TABLET | ORAL | 3 refills | Status: DC

## 2019-01-02 NOTE — Progress Notes (Signed)
   Virtual Visit via Video Note The purpose of this virtual visit is to provide medical care while limiting exposure to the novel coronavirus.    Consent was obtained for video visit:  Yes.   Answered questions that patient had about telehealth interaction:  Yes.   I discussed the limitations, risks, security and privacy concerns of performing an evaluation and management service by telemedicine. I also discussed with the patient that there may be a patient responsible charge related to this service. The patient expressed understanding and agreed to proceed.  Pt location: Home Physician Location: office Name of referring provider:  Blair Heys, MD I connected with Raelene Bott Lampert at patients initiation/request on 01/02/2019 at  9:00 AM EDT by video enabled telemedicine application and verified that I am speaking with the correct person using two identifiers. Pt MRN:  [id number] Pt DOB:  [birth year]   History of Present Illness:  This is a 58 year-old female with allergies and insomnia returning for follow-up of restless leg syndrome, low back pain, and speech changes.   Restless leg syndrome is doing well and remains controlled on mirapex. Due to a fall she suffered in on March 9th, she has been wheelchair bound and adjusted her mirapex to 0.25mg  at  6pm and 0.25mg  at 8:30p, which is effective.      She has noticed that speech changes are improved, as she is trying to speech slower.   Low back pain also has resolved with stretching exercises.   Observations/Objective:    Vital signs:  BP 118/75 She is awake, alert, and easily engages in conversation.  She is well-appearing, no distress. Speech is clear, enunciating well.  Face is symmetric.   Data: Lab Results  Component Value Date   TSH 2.69 06/12/2018   Lab Results  Component Value Date   VITAMINB12 516 06/12/2018     Assessment and Plan:   1.  Restless leg syndrome, well-controlled on mirapex 0.25mg  which she takes at  6pm and 8:30pm. Extra dose as needed when travelling.  Refills provided.   2.  Word-finding difficulty, speech changes.  TSH and B12 reviewed and is normal.  Clinically, she has noticed that speech changes are improved with talking slower.    3.  Lumbar strain, resolved.   Follow Up Instructions:   I discussed the assessment and treatment plan with the patient. The patient was provided an opportunity to ask questions and all were answered. The patient agreed with the plan and demonstrated an understanding of the instructions.   The patient was advised to call back or seek an in-person evaluation if the symptoms worsen or if the condition fails to improve as anticipated.  Return to clinic in 6 months  Total Time spent in visit with the patient was:  15 minutes, of which more than 50% of the time was spent in counseling and/or coordinating care.   Pt understands and agrees with the plan of care outlined.     Glendale Chard, DO

## 2019-01-17 ENCOUNTER — Ambulatory Visit: Payer: Managed Care, Other (non HMO) | Admitting: Neurology

## 2019-03-21 ENCOUNTER — Other Ambulatory Visit: Payer: Self-pay | Admitting: Otolaryngology

## 2019-07-02 ENCOUNTER — Encounter: Payer: Self-pay | Admitting: Neurology

## 2019-07-02 ENCOUNTER — Ambulatory Visit: Payer: Managed Care, Other (non HMO) | Admitting: Neurology

## 2019-07-02 ENCOUNTER — Other Ambulatory Visit: Payer: Self-pay

## 2019-07-02 VITALS — BP 112/64 | HR 78 | Ht 63.0 in | Wt 135.0 lb

## 2019-07-02 DIAGNOSIS — H8112 Benign paroxysmal vertigo, left ear: Secondary | ICD-10-CM

## 2019-07-02 DIAGNOSIS — G2581 Restless legs syndrome: Secondary | ICD-10-CM

## 2019-07-02 MED ORDER — MECLIZINE HCL 12.5 MG PO TABS: 12.5000 mg | ORAL_TABLET | Freq: Two times a day (BID) | ORAL | 0 refills | Status: DC

## 2019-07-02 NOTE — Patient Instructions (Addendum)
Start meclizine 12.5mg  - 25mg  twice daily  Start vestibular therapy  If you develop any new neurological symptoms, please contact my office  Continue pramipexole 0.5mg  at 8:30p as you are  Do not allow manipulation of your neck  Return to clinic in 6 month

## 2019-07-02 NOTE — Progress Notes (Signed)
Follow-up Visit   Date: 07/02/19   Miranda Salazar MRN: [id number] DOB: 13-Dec-1960   Interim History: Miranda Salazar is a 58 y.o. right-handed Caucasian female with allergies and insomnia returning to the clinic for follow-up of restless leg syndrome and new complaints of vertigo.  The patient was accompanied to the clinic by self.  History of present illness: RLS started around the age of 56 when she finished with the [employer].  She has achy, restless sensation of the legs which is worse in the car, plane, and night.  Symptoms gradually progressed where she sought medical advice for symptoms and was started on on mirapex in September 2013 which was control symptoms.    In March 2019, she began needing higher dose of mirapex to 0.75mg /d raising concern of augmentation.  Therefore, she was offered gabapentin 100mg  BID and stopped this due to sedation and ineffectiveness.  Her current dose of mirapex controls symptoms very well.  She self adjusts the dose of mirapex based on the level of activity in her day. She typically takes mirapex 0.25mg  (2) tablets at bedtime which controls symptoms.  On days when she is travelling, she takes mirapex 0.25mg  (1) tablet 30-min prior to travel.   She also complains of speech changes, described as word-finding difficulty in conversation or remembering names.  This occurs sporadically throughout the day.  She does not have slurred speech.  Sometimes, pronunciation of works is difficult. 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  She gives a lot of presentation and speaks pubically regularly for work.  No problems with comprehension or reading. She denies misplacing items and maintains all IADLs.   Sometimes, she repeats question and feels short-term memory is not as strong as before.  Mood is good. Sleep is fair.   For the past two months, she has been  experiencing achy low back pain, as if she was hit in the back. She does not recall preceding injury or trauma.  There is no associated radicular pain or paresthesias of the legs. NSAIDs provides relief. She does a significant amount of travel for work and feels this may be contributing to it.   UPDATE 07/02/2019:  She is here for follow-up visit.  Her RLS is well-controlled on mirapex 0.25mg  - 2 tablets at 8:30p and extra dose for long travels.  She is tolerating it well.  Her speech changes are also improved when talking slowly.  Today, she is complaining of vertigo, with room-spinning.  She has nausea, no vomiting.  She's had this in the past.  Symptoms are aggravated by rolling or turning head to the left.  She has had 4 chiropractic treatment of her neck over the past few weeks.  Symptoms did not start immediately following this.  Her last treatment was 4-day prior to vertigo onset.   Medications:  Current Outpatient Medications on File Prior to Visit  Medication Sig Dispense Refill  . ibandronate (BONIVA) 150 MG tablet     . loratadine (CLARITIN) 10 MG tablet Take 10 mg by mouth daily as needed for allergies.    . pramipexole (MIRAPEX) 0.25 MG tablet TAKE 2 TABLETS 2 HOURS BEFORE BEDTIME, MAY TAKE EXTRA 1/2 TO 1 TABLET AS NEEDED FOR RLS SYMPTOMS 270 tablet 3  . traZODone (DESYREL) 50 MG tablet Take 50 mg by mouth at bedtime as needed for sleep. Reported on 09/26/2015  No current facility-administered medications on file prior to visit.     Allergies:  Allergies  Allergen Reactions  . Penicillins Rash    Review of Systems:  CONSTITUTIONAL: No fevers, chills, night sweats, or weight loss.  EYES: No visual changes or eye pain ENT: No hearing changes.  No history of nose bleeds. +vertigo  RESPIRATORY: No cough, wheezing and shortness of breath.   CARDIOVASCULAR: Negative for chest pain, and palpitations.   GI: Negative for abdominal discomfort, blood in stools or black stools.  No  recent change in bowel habits.   GU:  No history of incontinence.   MUSCLOSKELETAL: No history of joint pain or swelling.  No myalgias.   SKIN: Negative for lesions, rash, and itching.   ENDOCRINE: Negative for cold or heat intolerance, polydipsia or goiter.   PSYCH:  No depression or anxiety symptoms.   NEURO: As Above.   Vital Signs:  BP 112/64   Pulse 78   Ht 5\' 3"  (1.6 m)   Wt 135 lb (61.2 kg)   LMP 10/16/2012   SpO2 98%   BMI 23.91 kg/m    General Medical Exam:   General:   Appears slightly uncomfortable and tired  Eyes/ENT: see cranial nerve examination.   Neck:  No carotid bruits. Respiratory:  Clear to auscultation, good air entry bilaterally.   Cardiac:  Regular rate and rhythm, no murmur.   Ext:  No edema   Neurological Exam: MENTAL STATUS including orientation to time, place, person, recent and remote memory, attention span and concentration, language, and fund of knowledge is normal.  Speech is not dysarthric.  CRANIAL NERVES:  No visual field defects.  Pupils equal round and reactive to light.  Normal conjugate, extra-ocular eye movements in all directions of gaze.  Fatigable nystagmus with gaze to the left.  No ptosis.  Face is symmetric.   MOTOR:  Motor strength is 5/5 in all extremities.  No atrophy, fasciculations or abnormal movements.  No pronator drift.  Tone is normal.    MSRs:  Reflexes are 2+/4 throughout  COORDINATION/GAIT:    Gait mildly wide based and stable.   Data: n/a  IMPRESSION/PLAN: 1.  Restless leg syndrome, well-controlled  -Continue pramipexole 0.25mg  - take 2 tablet at 8:30pm.  Okay to take extra dose as needed for long travels  2.  Benign paroxysmal positional vertigo - left.  I do not see any neurological deficits on exam or by symptomatology which make me concerned for vascular or central nervous system disorder.  I have discouraged her from cervical manipulation in the future.  My suspicion for vertebral dissection is low, as her  symptoms did not start immediately following chiropractic adjustment.  I have informed the patient that should she develop any new neurological symptoms, to seek medical care urgently  -Start meclizine 12.5 - 25mg  twice daily as needed  -Start vestibular therapy  -Epley maneuver was discussed and literature provided  Return to clinic in 6 months.    Thank you for allowing me to participate in patient's care.  If I can answer any additional questions, I would be pleased to do so.    Sincerely,     K. Posey Pronto, DO

## 2019-07-06 ENCOUNTER — Ambulatory Visit: Payer: Managed Care, Other (non HMO) | Admitting: Neurology

## 2019-12-28 ENCOUNTER — Encounter: Payer: Self-pay | Admitting: Neurology

## 2019-12-31 ENCOUNTER — Other Ambulatory Visit: Payer: Self-pay

## 2019-12-31 ENCOUNTER — Telehealth (INDEPENDENT_AMBULATORY_CARE_PROVIDER_SITE_OTHER): Payer: Managed Care, Other (non HMO) | Admitting: Neurology

## 2019-12-31 VITALS — Ht 63.0 in | Wt 138.0 lb

## 2019-12-31 DIAGNOSIS — G2581 Restless legs syndrome: Secondary | ICD-10-CM

## 2019-12-31 DIAGNOSIS — H8112 Benign paroxysmal vertigo, left ear: Secondary | ICD-10-CM | POA: Diagnosis not present

## 2019-12-31 MED ORDER — PRAMIPEXOLE DIHYDROCHLORIDE 0.5 MG PO TABS: ORAL_TABLET | ORAL | 3 refills | Status: DC

## 2019-12-31 NOTE — Progress Notes (Signed)
   Virtual Visit via Video Note The purpose of this virtual visit is to provide medical care while limiting exposure to the novel coronavirus.    Consent was obtained for video visit:  Yes.   Answered questions that patient had about telehealth interaction:  Yes.   I discussed the limitations, risks, security and privacy concerns of performing an evaluation and management service by telemedicine. I also discussed with the patient that there may be a patient responsible charge related to this service. The patient expressed understanding and agreed to proceed.  Pt location: Home Physician Location: office Name of referring provider:  Blair Heys, MD I connected with Miranda Salazar at patients initiation/request on 12/31/2019 at  8:30 AM EDT by video enabled telemedicine application and verified that I am speaking with the correct person using two identifiers. Pt MRN:  [id number] Pt DOB:  08-01-1961 Video Participants:  Miranda Salazar   History of Present Illness: This is a 59 y.o. female returning for follow-up of restless leg syndrome and BPPV.  At her last visit, she reported symptoms of vertigo and was started on meclizine and vestibular therapy.  She attended about 6 sessions of therapy which significantly improved symptoms and she has not had recurrence of dizziness.  Restless leg syndrome remains very well-controlled on ropinirole 0.25mg  - she takes 2 tablets at bedtime.  She has not needed to take extra dose since she has not been travelling.  She received 1st dose of Moderna vaccination and is schedule for the 2nd one.  No new complaints.   Observations/Objective:   Vitals:   12/28/19 1351  Weight: 138 lb (62.6 kg)  Height: 5\' 3"  (1.6 m)   Patient is awake, alert, and appears comfortable.  Oriented x 4.   Extraocular muscles are intact. No ptosis.  Face is symmetric.  Speech is not dysarthric.  Antigravity in all extremities.  No pronator drift. Gait appears  normal.   Assessment and Plan:  1.  Restless leg syndrome, well controlled  - Refills provided for pramipexole 0.5mg  tab at 8:30 PM.  Okay to take extra 1/2 tablet as needed for long travels  2.  Benign paroxysmal positional vertigo, resolved with vestibular therapy and meclizine   Follow Up Instructions:   I discussed the assessment and treatment plan with the patient. The patient was provided an opportunity to ask questions and all were answered. The patient agreed with the plan and demonstrated an understanding of the instructions.   The patient was advised to call back or seek an in-person evaluation if the symptoms worsen or if the condition fails to improve as anticipated.  Follow-up in 1 year    , DO

## 2020-03-11 IMAGING — CT CT OF THE LOWER RIGHT EXTREMITY WITHOUT CONTRAST
3 of 4 series · 14 of 34 positions shown, 17 images · non-contrast
Comparison: None.

CLINICAL DATA: The patient suffered a right ankle injury due to a
fall off a 5 foot step 12/11/2018. Initial encounter.

EXAM:
CT OF THE RIGHT ANKLE WITHOUT CONTRAST
TECHNIQUE: Multidetector CT imaging of the right ankle was performed according
to the standard protocol. Multiplanar CT image reconstructions were
also generated.

[Series 9: sfov lower extremity 2.00 br40 s3 cor soft · coronal · 0.22mm/px · 3 of 77 slices shown]
[im 16/77  bone]
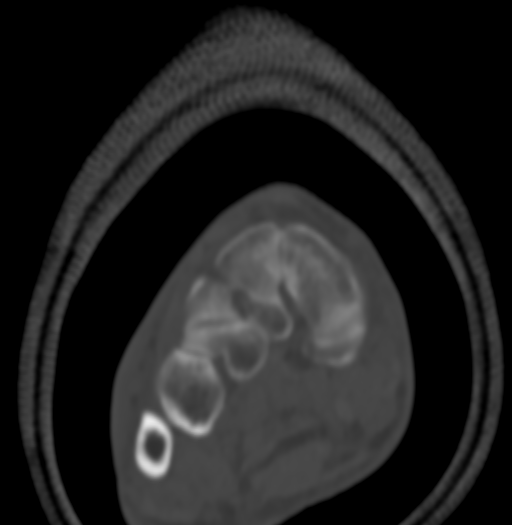
[im 31/77  bone]
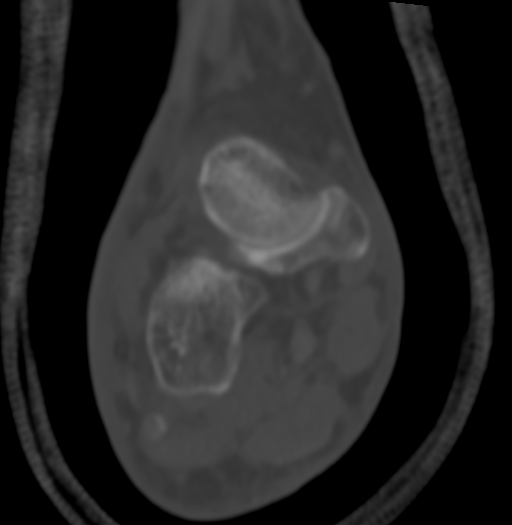
[im 46/77  bone]
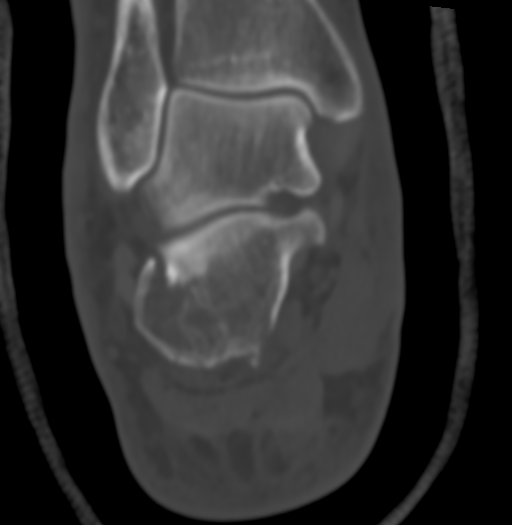

[Series 13: sfov lower extremity 2.00 br40 s3 sag soft · sagittal · 0.23mm/px · 5 of 56 slices shown, 6 images]
[im 19/56  bone]
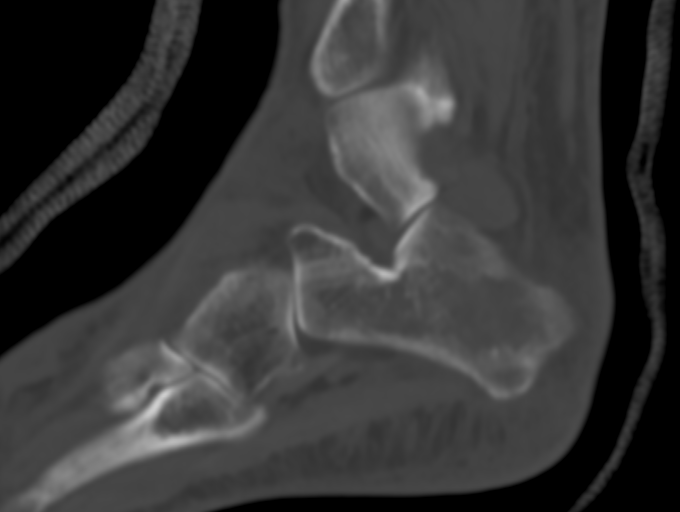
[im 23/56  bone]
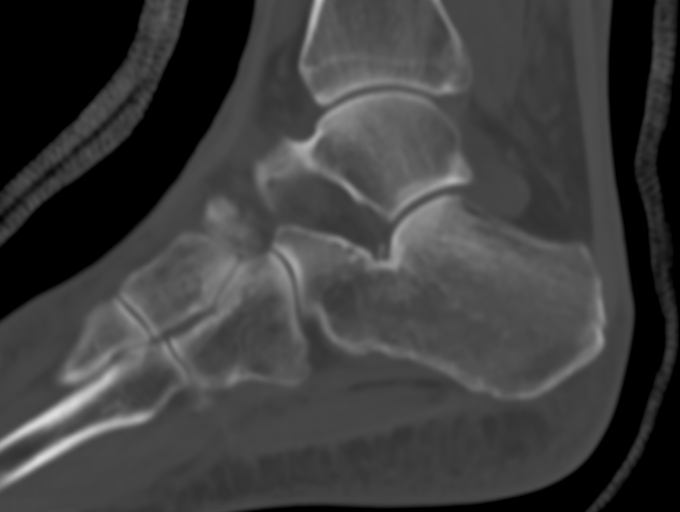
[im 28/56  soft-tissue]
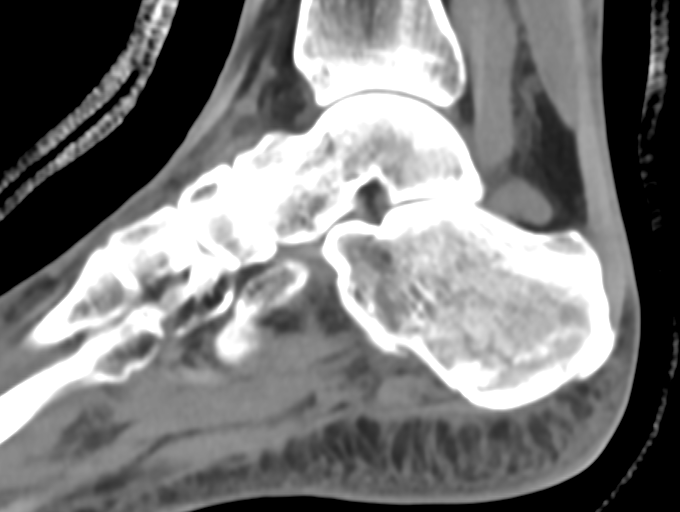
[im 28/56  bone]
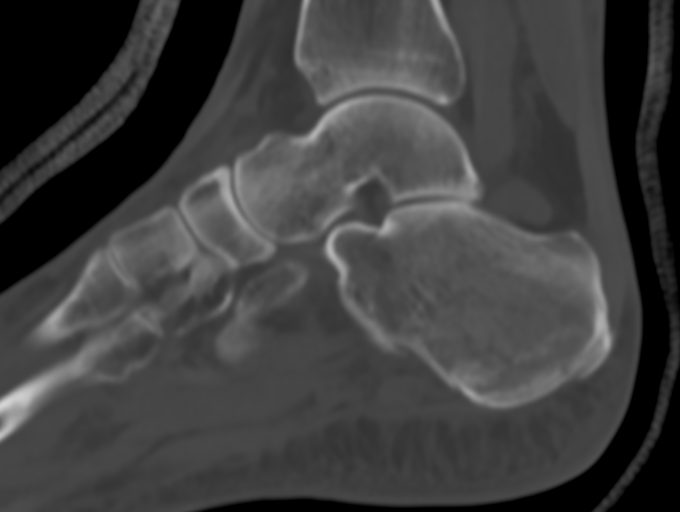
[im 33/56  bone]
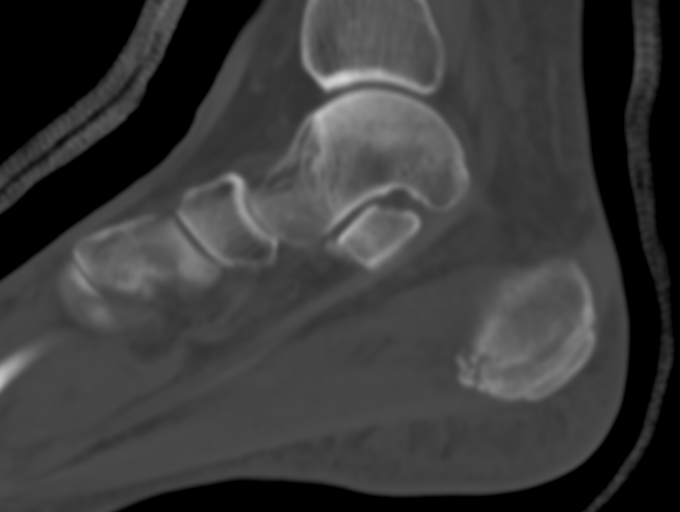
[im 37/56  bone]
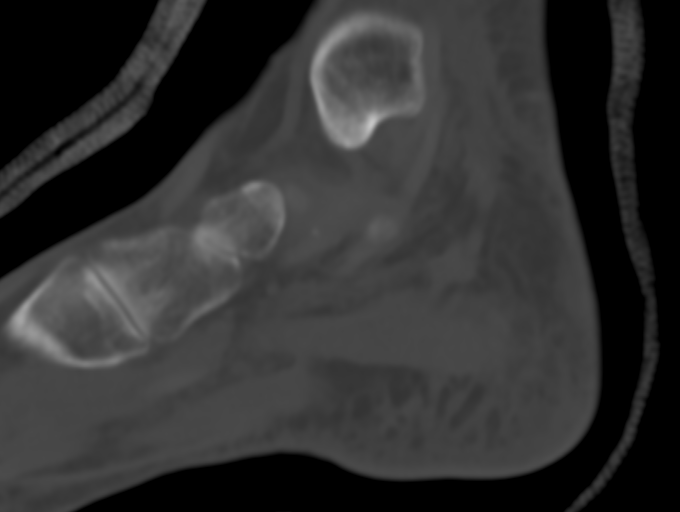

[Series 16: sfov lower extremity 0.60 br40 s3 soft thin · axial · 0.33mm/px · z∈[+570,+668]mm · 6 of 205 slices shown, 8 images]
[im 21/205  soft-tissue]
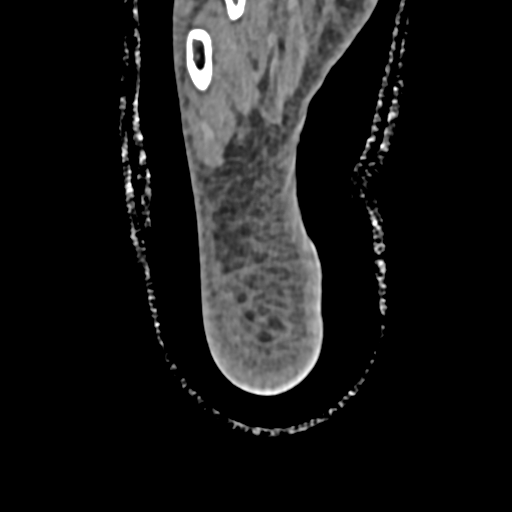
[im 21/205  bone]
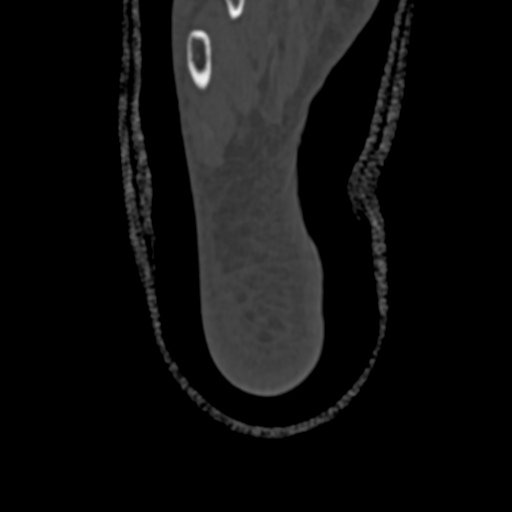
[im 62/205  bone]
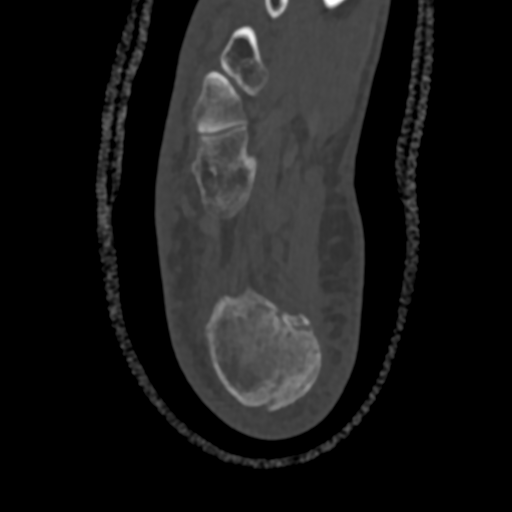
[im 82/205  bone]
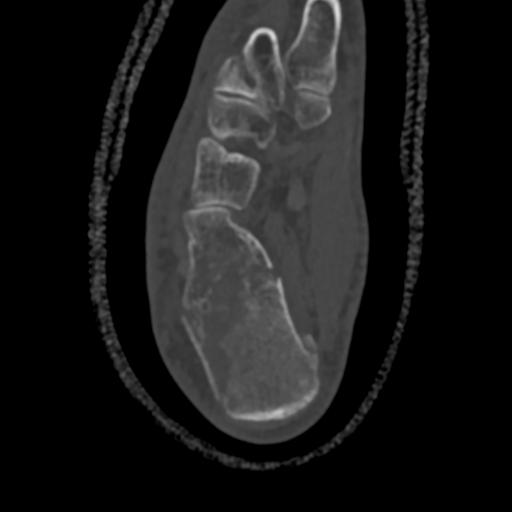
[im 123/205  bone]
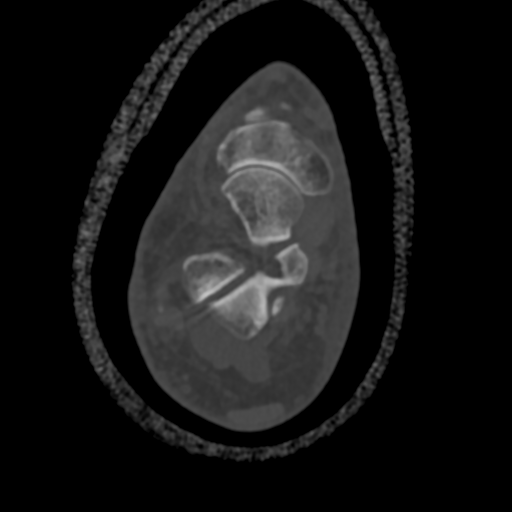
[im 143/205  soft-tissue]
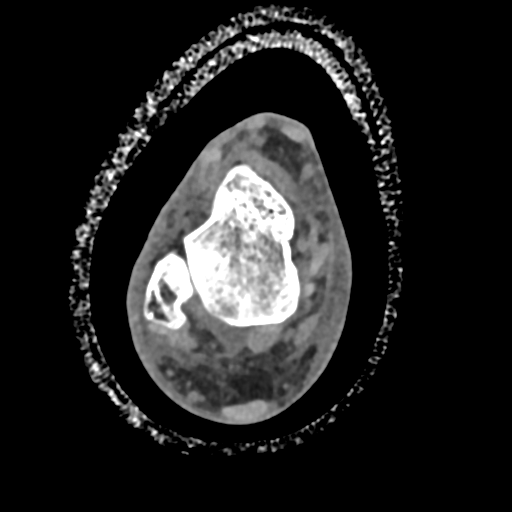
[im 143/205  bone]
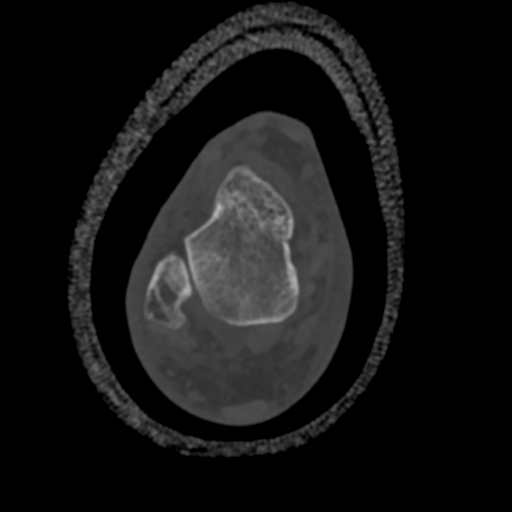
[im 184/205  bone]
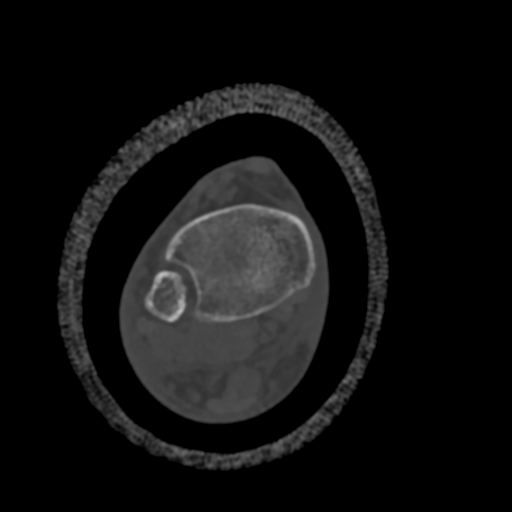

[14 of 34 positions shown; findings below may reference images not displayed]

FINDINGS: Bones/Joint/Cartilage

The patient has a mildly comminuted acute fracture of the calcaneus.
Main fracture lines extend through the body from the medial aspect
of the heel and medial aspect of the posterior body through the
junction of the middle and distal thirds of the body on both the
medial and lateral sides. The posterior facet of the calcaneus is
spared. There is a fracture through the middle facet anteriorly with
a step-off of 1-2 mm at the articular surface. The calcaneus at the
calcaneocuboid joint is spared. No other fracture is identified.

Ligaments

Suboptimally assessed by CT. The syndesmosis is not widened. Major
ligamentous structures appear intact.

Muscles and Tendons

Intact.  No tendon entrapment is identified.

Soft tissues

There is some soft tissue swelling and hematoma about the patient's
fracture.
IMPRESSION: Fracture of the calcaneus demonstrates slight step-off at the
anterior margin of the middle facet of the subtalar joint. The
calcaneus at the calcaneocuboid joint and posterior facet of the
subtalar joint is spared consistent with Jungsook Ober type 1 injury.

3-dimensional CT images were rendered by post-processing of the
original CT data at the CT scanner. The 3-dimensional CT images were
interpreted, and findings were reported in the accompanying complete
CT report for this study.

## 2020-03-11 IMAGING — CT 3-DIMENSIONAL CT IMAGE RENDERING ON INDEPENDENT WORKSTATION
3 of 4 series · 14 of 33 positions shown, 17 images · non-contrast
Comparison: None.

CLINICAL DATA: Left heel pain. Nonspecific (abnormal) findings on
radiological and other examination of musculoskeletal system.

EXAM:
CT OF THE LOWER LEFT EXTREMITY WITHOUT CONTRAST
CT - 3-DIMENSIONAL CT IMAGE RENDERING ON INDEPENDENT WORKSTATION
TECHNIQUE: Multidetector CT imaging of the lower left extremity was performed
according to the standard protocol.

[Series 9: sfov lower extremity 2.00 br40 s3 cor soft · coronal · 0.22mm/px · 3 of 71 slices shown]
[im 15/71  bone]
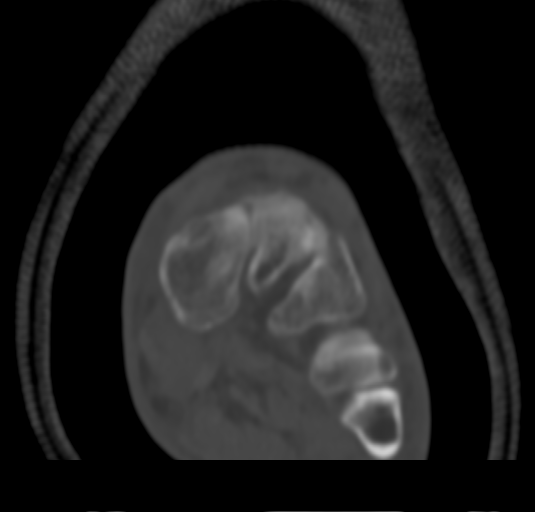
[im 29/71  bone]
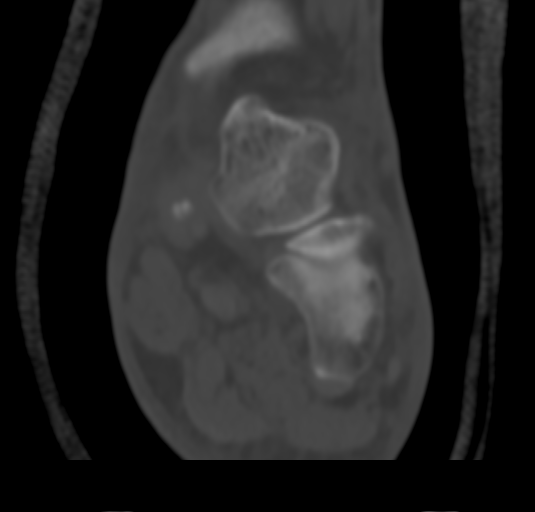
[im 43/71  bone]
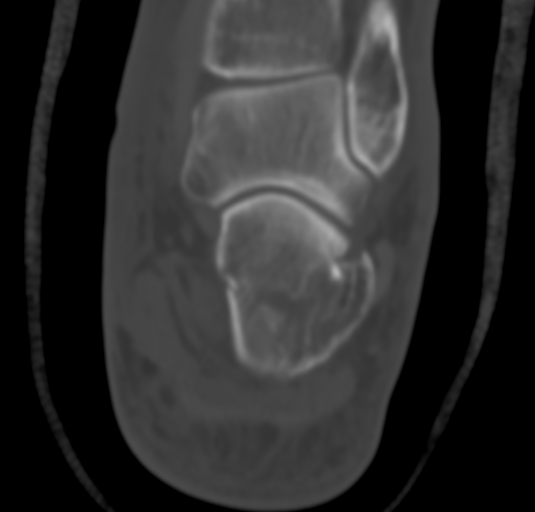

[Series 13: sfov lower extremity 2.00 br40 s3 sag soft · sagittal · 0.22mm/px · 5 of 58 slices shown, 6 images]
[im 20/58  bone]
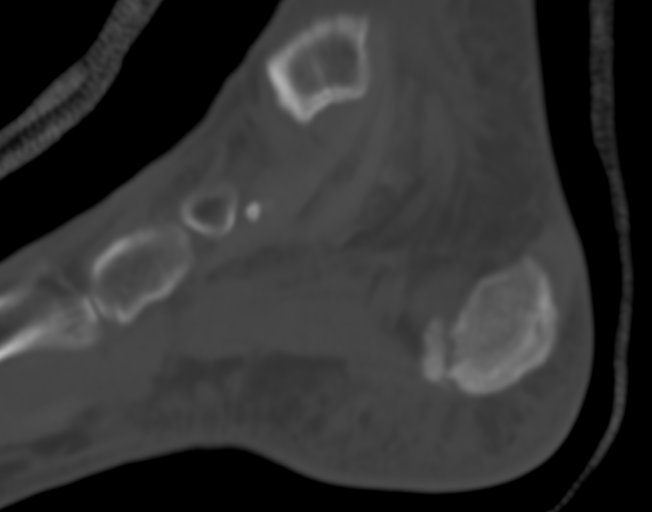
[im 24/58  bone]
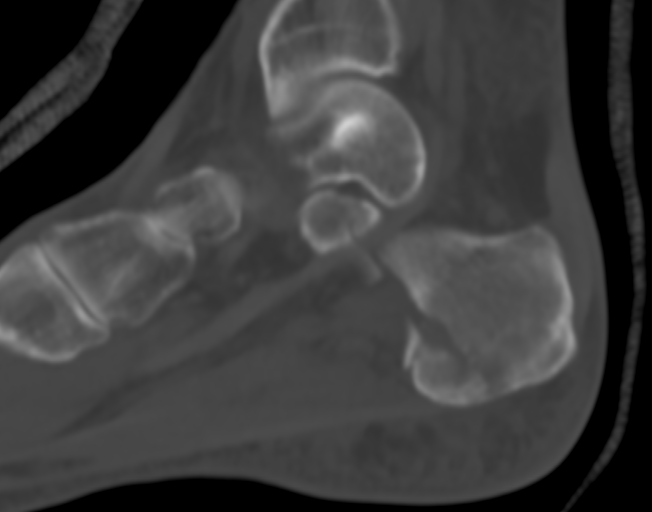
[im 29/58  soft-tissue]
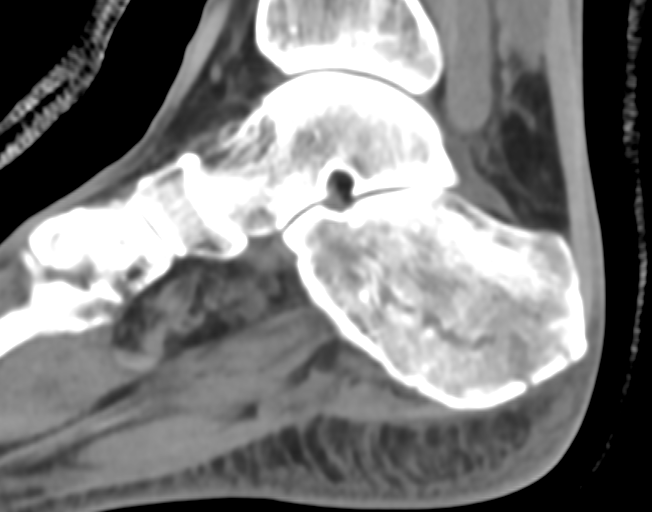
[im 29/58  bone]
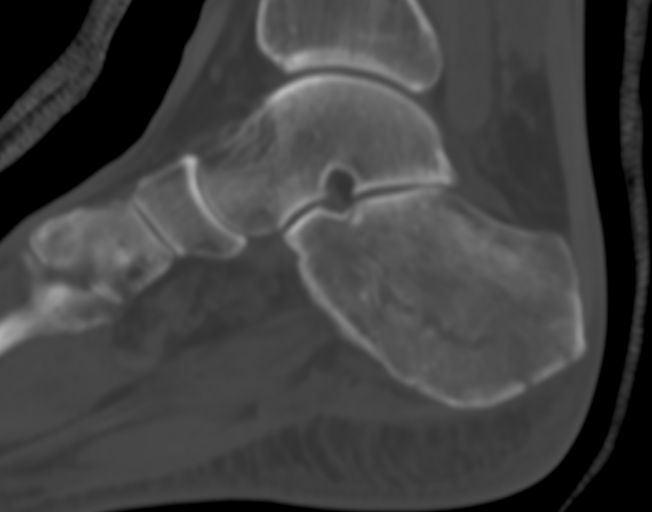
[im 34/58  bone]
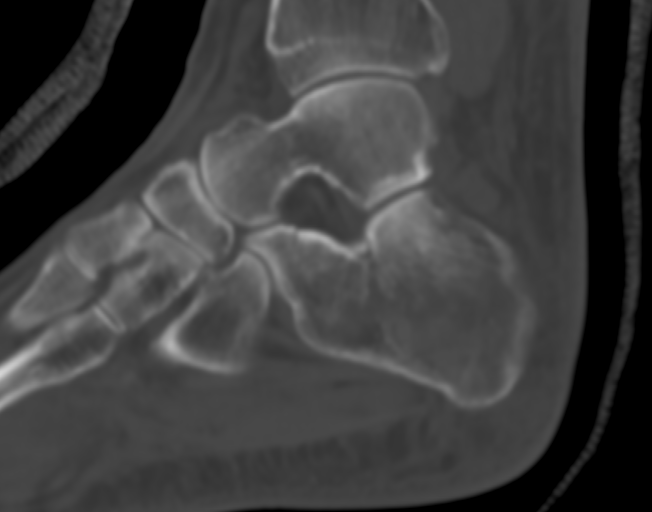
[im 39/58  bone]
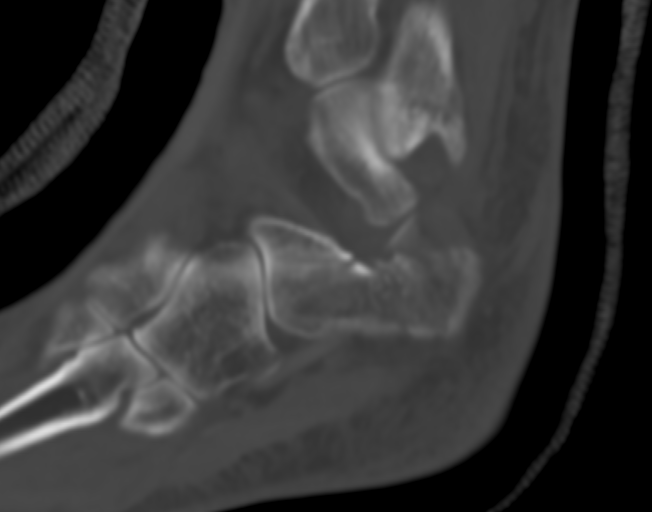

[Series 16: sfov lower extremity 0.60 br40 s3 soft thin · axial · 0.29mm/px · z∈[+562,+656]mm · 6 of 196 slices shown, 8 images]
[im 20/196  soft-tissue]
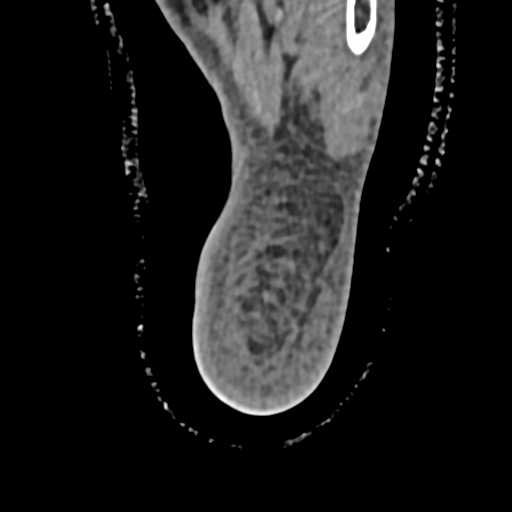
[im 20/196  bone]
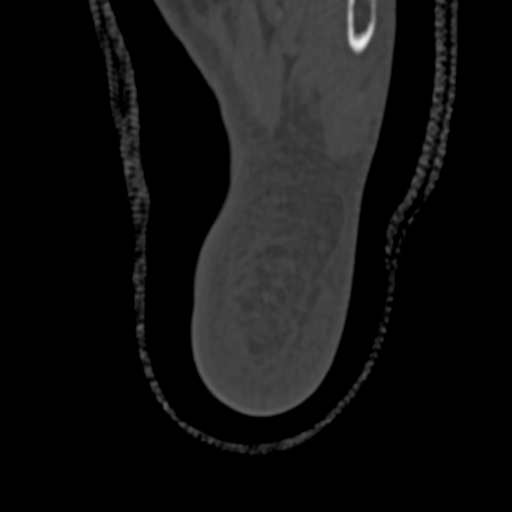
[im 59/196  bone]
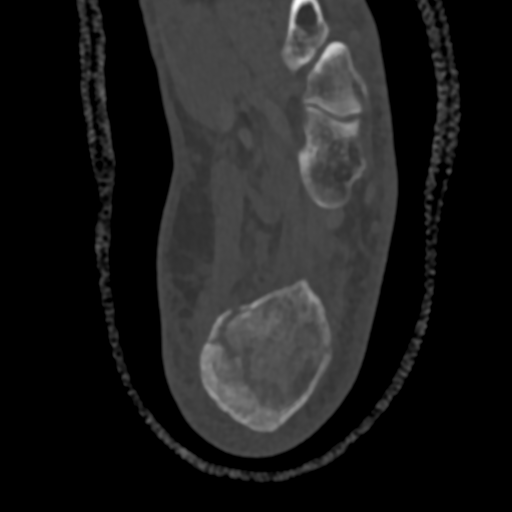
[im 79/196  bone]
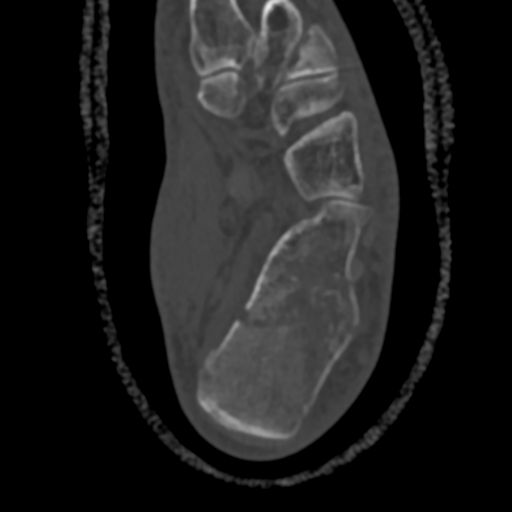
[im 118/196  bone]
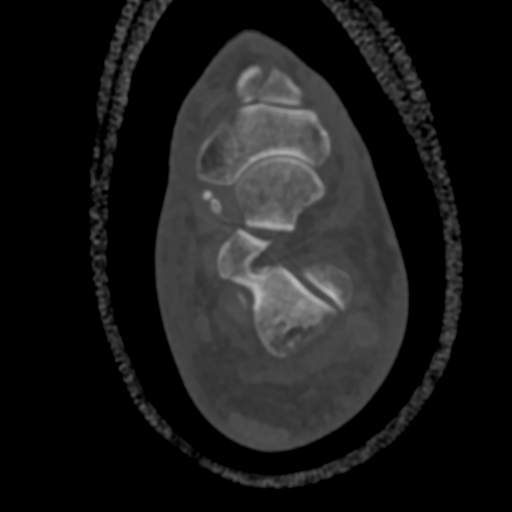
[im 137/196  soft-tissue]
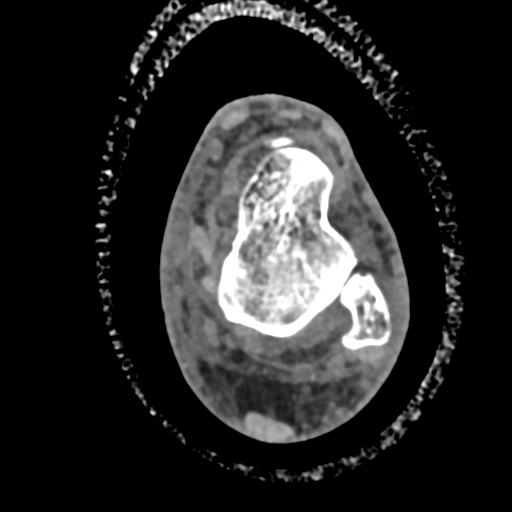
[im 137/196  bone]
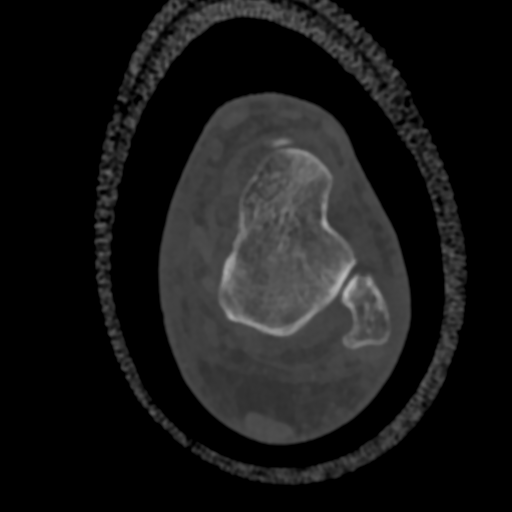
[im 176/196  bone]
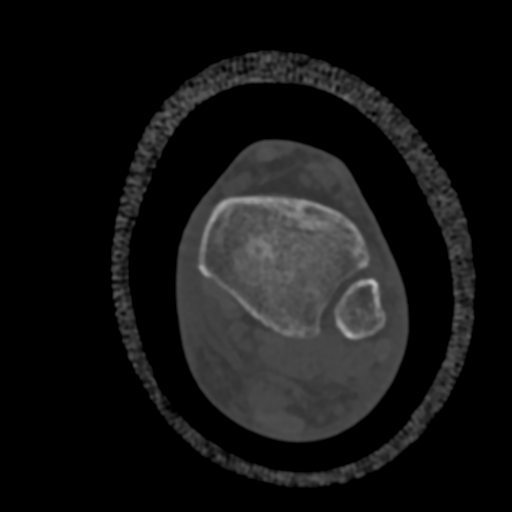

[14 of 33 positions shown; findings below may reference images not displayed]

FINDINGS: Bones/Joint/Cartilage

Generalized osteopenia.

Comminuted mid calcaneal fracture without significant displacement
or angulation. Fracture does not extend to the posterior subtalar
joint. Fracture involves the peripheral margin of the middle facet.

No other acute fracture or dislocation.

Normal alignment. Small subtalar joint effusion.

Ligaments

Ligaments are suboptimally evaluated by CT.

Muscles and Tendons
Muscles are normal. Flexor, extensor, peroneal and Achilles tendons
are intact.

Soft tissue
No fluid collection or hematoma.  No soft tissue mass.
IMPRESSION: Comminuted mid calcaneal fracture without significant displacement
or angulation most consistent with Ashikuni 1 classification.
Fracture does not extend to the posterior subtalar joint. Fracture
involves the peripheral margin of the middle facet.

## 2021-01-05 ENCOUNTER — Encounter: Payer: Self-pay | Admitting: Neurology

## 2021-01-05 ENCOUNTER — Ambulatory Visit (INDEPENDENT_AMBULATORY_CARE_PROVIDER_SITE_OTHER): Payer: Managed Care, Other (non HMO) | Admitting: Neurology

## 2021-01-05 ENCOUNTER — Other Ambulatory Visit: Payer: Self-pay

## 2021-01-05 VITALS — BP 136/82 | HR 77 | Ht 64.0 in | Wt 142.0 lb

## 2021-01-05 DIAGNOSIS — G2581 Restless legs syndrome: Secondary | ICD-10-CM | POA: Diagnosis not present

## 2021-01-05 NOTE — Progress Notes (Signed)
    Follow-up Visit   Date: 01/05/21   Miranda Salazar MRN: [id number] DOB: 07-01-1961   Interim History: Miranda Salazar is a 60 y.o. right-handed Caucasian female with allergies and insomnia returning to the clinic for follow-up of restless leg syndrome.  The patient was accompanied to the clinic by self.  She is here for 1 year follow-up visit.  Restless leg syndrome remains well controlled on ropinirole 0.5mg  at 8:30p, as long as she takes it on time.  She does have worsening symptoms, if there is delay in taking the medication in which case she needs to take 1mg , but this is not often.  She gets adequate relief with taking 1 tablet prior to flight travel. No new complaints.   Medications:  Current Outpatient Medications on File Prior to Visit  Medication Sig Dispense Refill  . azelastine (ASTELIN) 0.1 % nasal spray azelastine 137 mcg (0.1 %) nasal spray aerosol    . ibandronate (BONIVA) 150 MG tablet     . loratadine (CLARITIN) 10 MG tablet Take 10 mg by mouth daily as needed for allergies.    . pramipexole (MIRAPEX) 0.5 MG tablet TAKE 1 TABLETS 2 HOURS BEFORE BEDTIME, MAY TAKE EXTRA 1/2 TO 1 TABLET AS NEEDED FOR RLS SYMPTOMS 100 tablet 3  . traZODone (DESYREL) 50 MG tablet Take 50 mg by mouth at bedtime as needed for sleep. Reported on 09/26/2015     No current facility-administered medications on file prior to visit.    Allergies:  Allergies  Allergen Reactions  . Penicillins Rash    Vital Signs:  BP 136/82   Pulse 77   Ht 5\' 4"  (1.626 m)   Wt 142 lb (64.4 kg)   LMP 10/16/2012   SpO2 97%   BMI 24.37 kg/m   Neurological Exam: MENTAL STATUS including orientation to time, place, person, recent and remote memory, attention span and concentration, language, and fund of knowledge is normal.  Speech is not dysarthric.  MOTOR:  Motor strength is 5/5 in all extremities.  No atrophy, fasciculations or abnormal movements.  No pronator drift.  Tone is normal.    MSRs:   Reflexes are 2+/4 throughout  COORDINATION/GAIT:    Gait narrow and stable.   Data: n/a  IMPRESSION/PLAN: Restless leg syndrome, well-controlled  - Continue pramipexole 0.5mg  at 8:30p.    - OK to take extra tablet as needed for long travels  Return to clinic in 1 year  Thank you for allowing me to participate in patient's care.  If I can answer any additional questions, I would be pleased to do so.    Sincerely,    Aalani Aikens K. , DO

## 2021-01-05 NOTE — Patient Instructions (Signed)
Return to clinic in 1 year.

## 2022-01-06 ENCOUNTER — Encounter: Payer: Self-pay | Admitting: Neurology

## 2022-01-06 ENCOUNTER — Telehealth (INDEPENDENT_AMBULATORY_CARE_PROVIDER_SITE_OTHER): Payer: Managed Care, Other (non HMO) | Admitting: Neurology

## 2022-01-06 VITALS — Ht 64.0 in | Wt 140.0 lb

## 2022-01-06 DIAGNOSIS — G2581 Restless legs syndrome: Secondary | ICD-10-CM | POA: Diagnosis not present

## 2022-01-06 MED ORDER — PRAMIPEXOLE DIHYDROCHLORIDE 0.5 MG PO TABS: ORAL_TABLET | ORAL | 3 refills | Status: DC

## 2022-01-06 NOTE — Progress Notes (Signed)
? ?  Virtual Visit via Video Note ?The purpose of this virtual visit is to provide medical care while limiting exposure to the novel coronavirus.   ? ?Consent was obtained for video visit:  Yes.   ?Answered questions that patient had about telehealth interaction:  Yes.   ?I discussed the limitations, risks, security and privacy concerns of performing an evaluation and management service by telemedicine. I also discussed with the patient that there may be a patient responsible charge related to this service. The patient expressed understanding and agreed to proceed. ? ?Pt location: [place], [place] ?Physician Location: office ?Name of referring provider:  Blair Heys, MD ?I connected with Miranda Salazar at patients initiation/request on 01/06/2022 at  7:50 AM EDT by video enabled telemedicine application and verified that I am speaking with the correct person using two identifiers. ?Pt MRN:  [id number] ?Pt DOB:  05-01-1961 ?Video Participants:  Miranda Salazar ? ? ?History of Present Illness: This is a 61 y.o. female returning for follow-up of RLS.  Her RLS has been doing great on pramipexole 0.5mg  at bedtime (currently she takes two of 0.25mg  tablets).  She is able to sleep through the night and does not wake up with breakthrough symptoms.  For long distance travels, she may take an extra 0.25mg , but has not needed to do this in many months.  No new neurological complaints.  No interval falls, illness, or hospitalizations. ? ?Observations/Objective:   ?Vitals:  ? 01/06/22 0751  ?Weight: 140 lb (63.5 kg)  ?Height: 5\' 4"  (1.626 m)  ? ?Patient is awake, alert, and appears comfortable.  Oriented x 4.   ?Extraocular muscles are intact. No ptosis.  Face is symmetric.  Speech is not dysarthric.  ?Antigravity in all extremities.  No pronator drift. ?Gait appears normal . ? ? ?Assessment and Plan:  ?Restless leg syndrome, stable and well-controlled.  ? - Continue pramipexole 0.5mg  at bedtime  - refilled ? - For prolonged travel, ok  to take extra 0.25mg  (half tablet) as needed ? ?Follow Up Instructions: ?  ?I discussed the assessment and treatment plan with the patient. The patient was provided an opportunity to ask questions and all were answered. The patient agreed with the plan and demonstrated an understanding of the instructions. ?  ?The patient was advised to call back or seek an in-person evaluation if the symptoms worsen or if the condition fails to improve as anticipated. ? ?Follow-up in 1 year ? ? ? , DO ? ?

## 2022-06-22 ENCOUNTER — Encounter: Payer: Self-pay | Admitting: Neurology

## 2022-11-17 ENCOUNTER — Other Ambulatory Visit: Payer: Self-pay

## 2022-11-17 MED ORDER — PRAMIPEXOLE DIHYDROCHLORIDE 0.5 MG PO TABS: ORAL_TABLET | ORAL | 3 refills | Status: DC

## 2022-11-17 NOTE — Telephone Encounter (Signed)
Refill request came from [facility] needing refill on Pramipexole Dihydrochloride 90 day supply/

## 2023-01-04 ENCOUNTER — Encounter: Payer: Self-pay | Admitting: Neurology

## 2023-01-04 ENCOUNTER — Ambulatory Visit: Payer: Managed Care, Other (non HMO) | Admitting: Neurology

## 2023-01-04 VITALS — BP 133/87 | HR 85 | Ht 64.0 in | Wt 140.0 lb

## 2023-01-04 DIAGNOSIS — G2581 Restless legs syndrome: Secondary | ICD-10-CM | POA: Diagnosis not present

## 2023-01-04 NOTE — Progress Notes (Signed)
    Follow-up Visit   Date: 01/04/23   Miranda Salazar MRN: [id number] DOB: 1961/06/15   Interim History: Miranda Salazar is a 62 y.o. right-handed Caucasian female with allergies and insomnia returning to the clinic for follow-up of restless leg syndrome.  The patient was accompanied to the clinic by self.  IMPRESSION/PLAN: Restless leg syndrome, stable and well-controlled  - Continue pramipexole 0.5mg  at bedtime  - OK to take extra pramipexole 0.5mg  as needed for prolonged travel  Return to clinic in 1 year  --------------------------------------- UPDATE 01/04/2023:  She is here for follow-up visit.  She has been doing great with no new medical conditions.  Restless leg syndrome is well-controlled on pramipexole 0.5mg  around 8:30p. She rarely needs to take an extra dose of pramipexole even for travel.  The only time the RLS is worse if she happens to skip the dose.    Medications:  Current Outpatient Medications on File Prior to Visit  Medication Sig Dispense Refill   azelastine (ASTELIN) 0.1 % nasal spray azelastine 137 mcg (0.1 %) nasal spray aerosol     ibandronate (BONIVA) 150 MG tablet      loratadine (CLARITIN) 10 MG tablet Take 10 mg by mouth daily as needed for allergies.     pramipexole (MIRAPEX) 0.5 MG tablet Take 1 tablet at 8:30p.  OK to take half tablet as needed. 100 tablet 3   traZODone (DESYREL) 50 MG tablet Take 50 mg by mouth at bedtime as needed for sleep. Reported on 09/26/2015     No current facility-administered medications on file prior to visit.    Allergies:  Allergies  Allergen Reactions   Penicillins Rash    Vital Signs:  BP 133/87   Pulse 85   Ht 5\' 4"  (1.626 m)   Wt 140 lb (63.5 kg)   LMP 10/16/2012   SpO2 99%   BMI 24.03 kg/m   Neurological Exam: MENTAL STATUS including orientation to time, place, person, recent and remote memory, attention span and concentration, language, and fund of knowledge is normal.  Speech is not  dysarthric.  CRANIAL NERVES:  Pupils round and reactive bilaterally.  Extraocular muscles intact.  Face is symmetric.    MOTOR:  Motor strength is 5/5 in all extremities.  No atrophy, fasciculations or abnormal movements.  No pronator drift.  Tone is normal.    COORDINATION/GAIT:    Gait narrow and stable.   Data: n/a   Thank you for allowing me to participate in patient's care.  If I can answer any additional questions, I would be pleased to do so.    Sincerely,    Carolie Mcilrath K. Posey Pronto, DO

## 2023-01-04 NOTE — Patient Instructions (Signed)
It was great to see you today!  I will see you back in 1 year 

## 2023-01-07 ENCOUNTER — Ambulatory Visit: Payer: Managed Care, Other (non HMO) | Admitting: Neurology

## 2023-01-13 ENCOUNTER — Other Ambulatory Visit: Payer: Self-pay | Admitting: Neurology

## 2023-05-20 ENCOUNTER — Telehealth: Payer: Self-pay | Admitting: Neurology

## 2023-05-20 NOTE — Telephone Encounter (Signed)
Caller stated her prescription of Pramipexole was denied and she wants to know why

## 2023-05-20 NOTE — Telephone Encounter (Signed)
Pt called back and stated to ignore the below message she has figured it out.

## 2023-06-08 ENCOUNTER — Other Ambulatory Visit (HOSPITAL_COMMUNITY): Payer: Self-pay | Admitting: Family Medicine

## 2023-06-08 DIAGNOSIS — I7090 Unspecified atherosclerosis: Secondary | ICD-10-CM

## 2023-06-14 ENCOUNTER — Ambulatory Visit (HOSPITAL_COMMUNITY)
Admission: RE | Admit: 2023-06-14 | Discharge: 2023-06-14 | Disposition: A | Discharge status: Home / Self Care | Payer: Managed Care, Other (non HMO) | Source: Ambulatory Visit | Attending: Family Medicine | Admitting: Family Medicine

## 2023-06-14 DIAGNOSIS — I7090 Unspecified atherosclerosis: Secondary | ICD-10-CM | POA: Insufficient documentation

## 2023-08-18 ENCOUNTER — Other Ambulatory Visit: Payer: Self-pay | Admitting: Neurology

## 2024-01-04 ENCOUNTER — Ambulatory Visit: Payer: Managed Care, Other (non HMO) | Admitting: Neurology

## 2024-01-04 ENCOUNTER — Encounter: Payer: Self-pay | Admitting: Neurology

## 2024-01-04 VITALS — BP 146/87 | HR 79 | Ht 64.0 in | Wt 132.0 lb

## 2024-01-04 DIAGNOSIS — G2581 Restless legs syndrome: Secondary | ICD-10-CM

## 2024-01-04 MED ORDER — CARBIDOPA-LEVODOPA 25-100 MG PO TABS: 1.0000 | ORAL_TABLET | Freq: Every evening | ORAL | 1 refills | Status: AC | PRN

## 2024-01-04 NOTE — Progress Notes (Signed)
    Follow-up Visit   Date: 01/04/24   Silvia Hightower Hommel MRN: [id number] DOB: 12-06-1960   Interim History: Miranda Salazar is a 63 y.o. right-handed Caucasian female with allergies and insomnia returning to the clinic for follow-up of restless leg syndrome.  The patient was accompanied to the clinic by self.  IMPRESSION/PLAN: Restless leg syndrome, mostly well-controlled however with her recent increased travel, symptoms have been more bothersome.    - Continue pramipexole 0.5mg  at bedtime and extra tablet as needed  - OK to take sinemet as needed for travel or worsening RLS symptoms  Return to clinic in 1 year  --------------------------------------- UPDATE 01/04/2023:  She is here for follow-up visit.  She has been doing great with no new medical conditions.  Restless leg syndrome is well-controlled on pramipexole 0.5mg  around 8:30p. She rarely needs to take an extra dose of pramipexole even for travel.  The only time the RLS is worse if she happens to skip the dose.   UPDATE 01/04/2024:  She is here for follow-up visit.  She has been traveling a lot more than usual over the past 3 months, so has noticed that her RLS has been much worse.  Most of the time, symptoms are well-controlled, but about two nights per week she needs to take extra pramipexole.   Medications:  Current Outpatient Medications on File Prior to Visit  Medication Sig Dispense Refill   azelastine (ASTELIN) 0.1 % nasal spray azelastine 137 mcg (0.1 %) nasal spray aerosol     ibandronate (BONIVA) 150 MG tablet      loratadine (CLARITIN) 10 MG tablet Take 10 mg by mouth daily as needed for allergies.     pramipexole (MIRAPEX) 0.5 MG tablet TAKE 1 TABLET AT 8:30 P.M. OK TO TAKE ADDITIONAL HALF TABLET AS NEEDED 100 tablet 4   traZODone (DESYREL) 50 MG tablet Take 50 mg by mouth at bedtime as needed for sleep. Reported on 09/26/2015     No current facility-administered medications on file prior to visit.    Allergies:   Allergies  Allergen Reactions   Penicillins Rash    Vital Signs:  BP (!) 146/87   Pulse 79   Ht 5\' 4"  (1.626 m)   Wt 132 lb (59.9 kg)   LMP 10/16/2012   SpO2 99%   BMI 22.66 kg/m   Neurological Exam: MENTAL STATUS including orientation to time, place, person, recent and remote memory, attention span and concentration, language, and fund of knowledge is normal.  Speech is not dysarthric.  CRANIAL NERVES:  Pupils round and reactive bilaterally.  Extraocular muscles intact.  Face is symmetric.    MOTOR:  Motor strength is 5/5 in all extremities.  No atrophy, fasciculations or abnormal movements.   COORDINATION/GAIT:    Gait narrow and stable.   Data: n/a   Thank you for allowing me to participate in patient's care.  If I can answer any additional questions, I would be pleased to do so.    Sincerely,    Arlicia Paquette K. Allena Katz, DO

## 2024-05-23 ENCOUNTER — Telehealth: Payer: Self-pay | Admitting: Neurology

## 2024-05-23 MED ORDER — PRAMIPEXOLE DIHYDROCHLORIDE 0.5 MG PO TABS: ORAL_TABLET | ORAL | 0 refills | Status: DC

## 2024-05-23 NOTE — Telephone Encounter (Signed)
 1. Which medications need refilled? (List name and dosage, if known) pramipexole   2. Which pharmacy/location is medication to be sent to? (include street and city if local pharmacy) Switching pharmacies due to insurance to [facility] on [address]

## 2024-05-23 NOTE — Telephone Encounter (Signed)
 Refill sent to new pharmacy

## 2024-08-08 ENCOUNTER — Other Ambulatory Visit: Payer: Self-pay | Admitting: Neurology

## 2024-11-02 ENCOUNTER — Ambulatory Visit
Admission: RE | Admit: 2024-11-02 | Discharge: 2024-11-02 | Disposition: A | Source: Ambulatory Visit | Attending: Family Medicine | Admitting: Family Medicine

## 2024-11-02 ENCOUNTER — Other Ambulatory Visit: Payer: Self-pay | Admitting: Family Medicine

## 2024-11-02 DIAGNOSIS — R1032 Left lower quadrant pain: Secondary | ICD-10-CM

## 2024-11-02 DIAGNOSIS — R109 Unspecified abdominal pain: Secondary | ICD-10-CM

## 2024-11-02 MED ORDER — IOPAMIDOL (ISOVUE-300) INJECTION 61%
100.0000 mL | Freq: Once | INTRAVENOUS | Status: AC | PRN
  Administered 2024-11-02: 100 mL via INTRAVENOUS

## 2025-01-07 ENCOUNTER — Ambulatory Visit: Admitting: Neurology
